# Patient Record
Sex: Female | Born: 1935 | Race: White | Hispanic: No | Marital: Married | Smoking: Former smoker
Health system: Southern US, Community
[De-identification: ages and names within clinical notes are randomized; demographics above are authoritative.]

## PROBLEM LIST (undated history)

## (undated) DIAGNOSIS — N189 Chronic kidney disease, unspecified: Secondary | ICD-10-CM

## (undated) DIAGNOSIS — C801 Malignant (primary) neoplasm, unspecified: Secondary | ICD-10-CM

## (undated) DIAGNOSIS — I10 Essential (primary) hypertension: Secondary | ICD-10-CM

## (undated) DIAGNOSIS — K219 Gastro-esophageal reflux disease without esophagitis: Secondary | ICD-10-CM

## (undated) DIAGNOSIS — I4891 Unspecified atrial fibrillation: Secondary | ICD-10-CM

## (undated) DIAGNOSIS — G4730 Sleep apnea, unspecified: Secondary | ICD-10-CM

## (undated) DIAGNOSIS — M1990 Unspecified osteoarthritis, unspecified site: Secondary | ICD-10-CM

## (undated) HISTORY — PX: ROTATOR CUFF REPAIR: SHX139

---

## 1982-03-24 HISTORY — PX: ABDOMINAL HYSTERECTOMY: SHX81

## 1992-03-24 HISTORY — PX: CHOLECYSTECTOMY: SHX55

## 2003-03-25 HISTORY — PX: FRACTURE SURGERY: SHX138

## 2012-03-24 HISTORY — PX: BREAST SURGERY: SHX581

## 2015-03-25 DIAGNOSIS — I499 Cardiac arrhythmia, unspecified: Secondary | ICD-10-CM

## 2015-03-25 HISTORY — PX: NASAL SINUS SURGERY: SHX719

## 2015-03-25 HISTORY — DX: Cardiac arrhythmia, unspecified: I49.9

## 2019-01-03 ENCOUNTER — Other Ambulatory Visit: Payer: Self-pay | Admitting: Urology

## 2019-01-03 DIAGNOSIS — N2889 Other specified disorders of kidney and ureter: Secondary | ICD-10-CM

## 2019-01-12 ENCOUNTER — Ambulatory Visit
Admission: RE | Admit: 2019-01-12 | Discharge: 2019-01-12 | Disposition: A | Discharge status: Home / Self Care | Payer: Self-pay | Source: Ambulatory Visit | Attending: Urology | Admitting: Urology

## 2019-01-12 ENCOUNTER — Encounter: Payer: Self-pay | Admitting: *Deleted

## 2019-01-12 ENCOUNTER — Other Ambulatory Visit: Payer: Self-pay

## 2019-01-12 DIAGNOSIS — N2889 Other specified disorders of kidney and ureter: Secondary | ICD-10-CM

## 2019-01-12 HISTORY — PX: IR RADIOLOGIST EVAL & MGMT: IMG5224

## 2019-01-12 NOTE — Consult Note (Signed)
Chief Complaint: Patient was consulted remotely today (TeleHealth) for ablation of a left renal mass at the request of Elder Love.    Referring Physician(s): Elder Love  History of Present Illness: Alicia Cole is a 85 y.o. female with a history of chronic kidney disease, stage III, followed by Dr. Aundra Dubin.  A renal ultrasound was performed in June after some worsening of renal function was noted with creatinine of 1.55 in February.  The ultrasound revealed what appeared to be a solid left renal mass and multiple bilateral renal cysts.  This led to MRI of the abdomen on 10/12/2018 and referral to Dr. Mikki Santee of [facility].  The MRI revealed a solid and enhancing mid to lower exophytic mass of the left kidney measuring approximately 2.5 x 2.4 x 2.9 cm.  The patient was referred to Dr. Percell Belt with IR at [facility] and she underwent attempted ablation on 12/06/2018 at [facility].  The patient states that at that time the colon could not be adequately mobilized away from the renal mass from what sounds like attempted hydro-dissection and ablation was aborted.  A biopsy of the mass was not performed at that time.  After a discussion with Dr. Jonette Eva, who had also performed a nephrectomy on Mrs. Nesler daughter, the patient has been referred here for another opinion regarding ablation of the left renal mass.  Mrs. Keasling denies any urinary symptoms.  She has no abdominal or flank pain.  She does complain of chronic fatigue but is able to perform activities of daily living independently and does housework.  She lives in a home with her husband.  Past Medical History: Left breast carcinoma:  Status post surgery, radiation therapy and chemotherapy in 2014.  The patient is followed by Dr. Nelda Marseille from [facility]. Atrial fibrillation Hypertension Gastroesophageal reflux Hypercholesteremia Sleep apnea. Uses CPAP at night for the past 6 months.  Allergies: No  known drug allergies.  Medications: Eliquis 5 mg BID Coreg 25 mb BID Zyrtec 10 mg QD Lasix 40 mg QD Prilosec 20 mg QD Magnesium 250 mg QD      Family History: Colon cancer, colonic polyps.  Social History   Socioeconomic History  . Marital status: Not on file    Spouse name: Not on file  . Number of children: Not on file  . Years of education: Not on file  . Highest education level: Not on file  Occupational History  . Not on file  Social Needs  . Financial resource strain: Not on file  . Food insecurity    Worry: Not on file    Inability: Not on file  . Transportation needs    Medical: Not on file    Non-medical: Not on file  Tobacco Use  . Smoking status: Not on file  Substance and Sexual Activity  . Alcohol use: Not on file  . Drug use: Not on file  . Sexual activity: Not on file  Lifestyle  . Physical activity    Days per week: Not on file    Minutes per session: Not on file  . Stress: Not on file  Relationships  . Social Herbalist on phone: Not on file    Gets together: Not on file    Attends religious service: Not on file    Active member of club or organization: Not on file    Attends meetings of clubs or organizations: Not on file    Relationship status: Not  on file  Other Topics Concern  . Not on file  Social History Narrative  . Not on file    ECOG Status: 0 - Asymptomatic  Review of Systems  Constitutional: Positive for fatigue. Negative for appetite change, chills and fever.  HENT: Negative.   Respiratory: Negative.   Cardiovascular: Negative.   Gastrointestinal: Negative.   Genitourinary: Negative.   Musculoskeletal: Negative.   Skin: Negative.   Neurological: Negative.     Review of Systems: A 12 point ROS discussed and pertinent positives are indicated in the HPI above.  All other systems are negative.  Physical Exam No direct physical exam was performed (except for noted visual exam findings with Video Visits).    Vital Signs: There were no vitals taken for this visit.  Imaging: No results found.  Labs:  CBC: No results for input(s): WBC, HGB, HCT, PLT in the last 8760 hours.  COAGS: No results for input(s): INR, APTT in the last 8760 hours.  BMP: Labs from 10/12/18 at [facility]: BUN 23, Cr 1.22, Na139, K 4.2, eGFR 45 mL/min.  Assessment and Plan:  I spoke with Mrs. Mancino over the phone.  I reviewed her MRI study from [facility] in July.  By my measurements, the left renal mass measures approximately 2.8 x 2.5 x 2.7 cm.  This mass is roughly 2/3 exophytic with one third endophytic component mostly within the lateral cortex of the interpolar left kidney.  The lesion appears solid and fairly uniformly enhances and is likely consistent with a renal carcinoma.  There is no evidence of renal vein tumor or metastatic disease in the abdomen by MRI.  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  Multiple bilateral renal cysts are present with the majority having the appearance of simple cysts.  A cluster of posterior lower pole cyst is mildly complex in appearance without definite enhancement.  I discussed the possibility of reattempting percutaneous cryoablation and also biopsy at the time of ablation with Mrs. Cragin.  In the setting of not having had prior colonic surgery or radiation to the abdomen, I am not sure exactly why the colon was unable to be adequately moved away from the renal mass utilizing a hydro-dissection maneuver during the September procedure.  It is possible that she may have had a large amount of fecal material in the colon at that time.  We can try a gentle bowel prep prior to a reattempted procedure.  A percutaneous ablation procedure would still be the optimal procedure for Mrs. Grace given her age and chronic kidney disease.  This would optimize remaining renal function and provide maximal  nephron sparing.  I did tell Mrs. Lacko that it is possible that a mass of the size may not give her any symptoms or metastatic disease for her remaining lifetime.  However, given its presence and the initial attempted procedure, Mrs. Szuch does not feel comfortable with leaving the lesion alone given that it is highly likely to be malignant based on imaging characteristics.  I believe that the mass should be amenable to percutaneous cryoablation under general anesthesia.  Biopsy will be performed at the same time to establish a tissue diagnosis.  Mrs. Butson would like to undergo percutaneous ablation.  The procedure will be scheduled at [facility] in [place].  We will begin the scheduling and authorization process.  Thank you for this interesting consult.  I greatly enjoyed meeting Kavin Leech  and look forward to participating in their care.  A copy of this report was sent to the requesting provider on this date.  Electronically Signed: Azzie Roup 01/12/2019, 8:50 AM     I spent a total of 40 Minutes in remote  clinical consultation, greater than 50% of which was counseling/coordinating care for treatment of a left renal mass.    Visit type: Audio only (telephone). Audio (no video) only due to patient's lack of internet/smartphone capability. Alternative for in-person consultation at [facility], [address] [place], [place], [place]. This visit type was conducted due to national recommendations for restrictions regarding the COVID-19 Pandemic (e.g. social distancing).  This format is felt to be most appropriate for this patient at this time.  All issues noted in this document were discussed and addressed.

## 2019-01-17 ENCOUNTER — Other Ambulatory Visit (HOSPITAL_COMMUNITY): Payer: Self-pay | Admitting: Interventional Radiology

## 2019-01-17 DIAGNOSIS — N2889 Other specified disorders of kidney and ureter: Secondary | ICD-10-CM

## 2019-01-27 ENCOUNTER — Other Ambulatory Visit: Payer: Self-pay | Admitting: Student

## 2019-01-27 NOTE — Patient Instructions (Addendum)
DUE TO COVID-19 ONLY ONE VISITOR IS ALLOWED TO COME WITH YOU AND STAY IN THE WAITING ROOM ONLY DURING PRE OP AND PROCEDURE DAY OF SURGERY. THE 1 VISITOR MAY VISIT WITH YOU AFTER SURGERY IN YOUR PRIVATE ROOM DURING VISITING HOURS ONLY!  YOU NEED TO HAVE A COVID 19 TEST ON_11/7/20______ @_12 :35 pm______, THIS TEST MUST BE DONE BEFORE SURGERY, COME  [address], [place] , [address].  ([place]) ONCE YOUR COVID TEST IS COMPLETED, PLEASE BEGIN THE QUARANTINE INSTRUCTIONS AS OUTLINED IN YOUR HANDOUT.                Alicia Cole    Your procedure is scheduled on: 02/02/19   Report to [facility] Main  Entrance   Report to admitting and then Radiology at 8:10 AM     Call this number if you have problems the morning of surgery [phone]    Remember: Do not eat food or drink liquids :After Midnight.   BRUSH YOUR TEETH MORNING OF SURGERY AND RINSE YOUR MOUTH OUT, NO CHEWING GUM CANDY OR MINTS.     Take these medicines the morning of surgery with A SIP OF WATER: Coreg, Claritin, Prilosec  Bring mask and tubing to the hosital                                You may not have any metal on your body including hair pins and              piercings              Do not wear jewelry, make-up, lotions, powders or perfumes, deodorant             Do not wear nail polish on your fingernails.  Do not shave  48 hours prior to surgery.           Do not bring valuables to the hospital. [place].  Contacts, dentures or bridgework may not be worn into surgery.       Patients discharged the day of surgery will not be allowed to drive home.     IF YOU ARE HAVING SURGERY AND GOING HOME THE SAME DAY, YOU MUST HAVE AN ADULT TO DRIVE YOU HOME AND BE WITH YOU FOR 24 HOURS  . YOU MAY GO HOME BY TAXI OR UBER OR ORTHERWISE, BUT AN ADULT MUST ACCOMPANY YOU HOME AND STAY WITH YOU FOR 24 HOURS.  Name and phone number of your  driver:  Special Instructions: N/A              Please read over the following fact sheets you were given: _____________________________________________________________________             [facility] - Preparing for Surgery  Before surgery, you can play an important role.   Because skin is not sterile, your skin needs to be as free of germs as possible.   You can reduce the number of germs on your skin by washing with CHG (chlorahexidine gluconate) soap before surgery.   CHG is an antiseptic cleaner which kills germs and bonds with the skin to continue killing germs even after washing. Please DO NOT use if you have an allergy to CHG or antibacterial soaps.   If your skin becomes reddened/irritated stop using the  CHG and inform your nurse when you arrive at Short Stay. Do not shave (including legs and underarms) for at least 48 hours prior to the first CHG shower.    Please follow these instructions carefully:  1.  Shower with CHG Soap the night before surgery and the  morning of Surgery.  2.  If you choose to wash your hair, wash your hair first as usual with your  normal  shampoo.  3.  After you shampoo, rinse your hair and body thoroughly to remove the  shampoo.                                        4.  Use CHG as you would any other liquid soap.  You can apply chg directly  to the skin and wash                       Gently with a scrungie or clean washcloth.  5.  Apply the CHG Soap to your body ONLY FROM THE NECK DOWN.   Do not use on face/ open                           Wound or open sores. Avoid contact with eyes, ears mouth and genitals (private parts).                       Wash face,  Genitals (private parts) with your normal soap.             6.  Wash thoroughly, paying special attention to the area where your surgery  will be performed.  7.  Thoroughly rinse your body with warm water from the neck down.  8.  DO NOT shower/wash with your normal soap after using and rinsing  off  the CHG Soap.             9.  Pat yourself dry with a clean towel.            10.  Wear clean pajamas.            11.  Place clean sheets on your bed the night of your first shower and do not  sleep with pets. Day of Surgery : Do not apply any lotions/deodorants the morning of surgery.  Please wear clean clothes to the hospital/surgery center.  FAILURE TO FOLLOW THESE INSTRUCTIONS MAY RESULT IN THE CANCELLATION OF YOUR SURGERY PATIENT SIGNATURE_________________________________  NURSE SIGNATURE__________________________________  ________________________________________________________________________

## 2019-01-28 ENCOUNTER — Other Ambulatory Visit: Payer: Self-pay

## 2019-01-28 ENCOUNTER — Encounter (HOSPITAL_COMMUNITY)
Admission: RE | Admit: 2019-01-28 | Discharge: 2019-01-28 | Disposition: A | Discharge status: Home / Self Care | Payer: Medicare Other | Source: Ambulatory Visit | Attending: Interventional Radiology | Admitting: Interventional Radiology

## 2019-01-28 ENCOUNTER — Encounter (HOSPITAL_COMMUNITY): Payer: Self-pay

## 2019-01-28 DIAGNOSIS — Z01812 Encounter for preprocedural laboratory examination: Secondary | ICD-10-CM | POA: Insufficient documentation

## 2019-01-28 HISTORY — DX: Essential (primary) hypertension: I10

## 2019-01-28 HISTORY — DX: Malignant (primary) neoplasm, unspecified: C80.1

## 2019-01-28 HISTORY — DX: Unspecified osteoarthritis, unspecified site: M19.90

## 2019-01-28 HISTORY — DX: Sleep apnea, unspecified: G47.30

## 2019-01-28 HISTORY — DX: Gastro-esophageal reflux disease without esophagitis: K21.9

## 2019-01-28 HISTORY — DX: Chronic kidney disease, unspecified: N18.9

## 2019-01-28 LAB — CBC WITH DIFFERENTIAL/PLATELET
Abs Immature Granulocytes: 0.09 K/uL — ABNORMAL HIGH (ref 0.00–0.07)
Basophils Absolute: 0 K/uL (ref 0.0–0.1)
Basophils Relative: 0 %
Eosinophils Absolute: 0.2 K/uL (ref 0.0–0.5)
Eosinophils Relative: 2 %
HCT: 39.7 % (ref 36.0–46.0)
Hemoglobin: 12.7 g/dL (ref 12.0–15.0)
Immature Granulocytes: 1 %
Lymphocytes Relative: 31 %
Lymphs Abs: 2.8 K/uL (ref 0.7–4.0)
MCH: 31 pg (ref 26.0–34.0)
MCHC: 32 g/dL (ref 30.0–36.0)
MCV: 96.8 fL (ref 80.0–100.0)
Monocytes Absolute: 0.7 K/uL (ref 0.1–1.0)
Monocytes Relative: 7 %
Neutro Abs: 5.2 K/uL (ref 1.7–7.7)
Neutrophils Relative %: 59 %
Platelets: 266 K/uL (ref 150–400)
RBC: 4.1 MIL/uL (ref 3.87–5.11)
RDW: 13.8 % (ref 11.5–15.5)
WBC: 8.9 K/uL (ref 4.0–10.5)
nRBC: 0 % (ref 0.0–0.2)

## 2019-01-28 LAB — BASIC METABOLIC PANEL
Anion gap: 7 (ref 5–15)
BUN: 21 mg/dL (ref 8–23)
CO2: 30 mmol/L (ref 22–32)
Calcium: 9.8 mg/dL (ref 8.9–10.3)
Chloride: 104 mmol/L (ref 98–111)
Creatinine, Ser: 1.24 mg/dL — ABNORMAL HIGH (ref 0.44–1.00)
GFR calc Af Amer: 46 mL/min — ABNORMAL LOW (ref >60)
GFR calc non Af Amer: 40 mL/min — ABNORMAL LOW (ref >60)
Glucose, Bld: 114 mg/dL — ABNORMAL HIGH (ref 70–99)
Potassium: 4.3 mmol/L (ref 3.5–5.1)
Sodium: 141 mmol/L (ref 135–145)

## 2019-01-28 LAB — PROTIME-INR
INR: 1.2 (ref 0.8–1.2)
Prothrombin Time: 14.7 s (ref 11.4–15.2)

## 2019-01-28 NOTE — Progress Notes (Signed)
PCP - Everardo All PA Cardiologist - Dr. Shirlee More  Chest x-ray -  EKG -  Stress Test -  ECHO -  Cardiac Cath -   Sleep Study - yes CPAP - uses C-Pap  Fasting Blood Sugar - NA Checks Blood Sugar _____ times a day  Blood Thinner Instructions:Eliquis Aspirin Instructions:stop 3 days prior to surgery. Last Dose:01/30/19  Anesthesia review:   Patient denies shortness of breath, fever, cough and chest pain at PAT appointment yes  Patient verbalized understanding of instructions that were given to them at the PAT appointment. Patient was also instructed that they will need to review over the PAT instructions again at home before surgery. Yes Called IR PA line to request cardiac clearance.01/28/19. left message

## 2019-01-29 ENCOUNTER — Other Ambulatory Visit (HOSPITAL_COMMUNITY)
Admission: RE | Admit: 2019-01-29 | Discharge: 2019-01-29 | Disposition: A | Discharge status: Home / Self Care | Payer: Medicare Other | Source: Ambulatory Visit | Attending: Interventional Radiology | Admitting: Interventional Radiology

## 2019-01-29 DIAGNOSIS — Z01812 Encounter for preprocedural laboratory examination: Secondary | ICD-10-CM | POA: Insufficient documentation

## 2019-01-29 DIAGNOSIS — Z20828 Contact with and (suspected) exposure to other viral communicable diseases: Secondary | ICD-10-CM | POA: Insufficient documentation

## 2019-01-29 LAB — ABO/RH: ABO/RH(D): A POS

## 2019-01-30 LAB — NOVEL CORONAVIRUS, NAA (HOSP ORDER, SEND-OUT TO REF LAB; TAT 18-24 HRS): SARS-CoV-2, NAA: NOT DETECTED

## 2019-02-01 ENCOUNTER — Other Ambulatory Visit: Payer: Self-pay | Admitting: Radiology

## 2019-02-02 ENCOUNTER — Observation Stay (HOSPITAL_COMMUNITY)
Admission: RE | Admit: 2019-02-02 | Discharge: 2019-02-03 | Disposition: A | Discharge status: Home / Self Care | Payer: Medicare Other | Attending: Interventional Radiology | Admitting: Interventional Radiology

## 2019-02-02 ENCOUNTER — Encounter (HOSPITAL_COMMUNITY): Payer: Self-pay | Admitting: Anesthesiology

## 2019-02-02 ENCOUNTER — Encounter (HOSPITAL_COMMUNITY)
Admission: RE | Disposition: A | Discharge status: Home / Self Care | Payer: Self-pay | Source: Home / Self Care | Attending: Interventional Radiology

## 2019-02-02 ENCOUNTER — Ambulatory Visit (HOSPITAL_COMMUNITY)
Admission: RE | Admit: 2019-02-02 | Discharge: 2019-02-02 | Disposition: A | Discharge status: Home / Self Care | Payer: Medicare Other | Source: Ambulatory Visit | Attending: Interventional Radiology | Admitting: Interventional Radiology

## 2019-02-02 ENCOUNTER — Ambulatory Visit (HOSPITAL_COMMUNITY): Payer: Medicare Other | Admitting: Physician Assistant

## 2019-02-02 ENCOUNTER — Other Ambulatory Visit: Payer: Self-pay

## 2019-02-02 ENCOUNTER — Ambulatory Visit (HOSPITAL_COMMUNITY): Payer: Medicare Other | Admitting: Anesthesiology

## 2019-02-02 ENCOUNTER — Ambulatory Visit (HOSPITAL_COMMUNITY): Payer: Medicare Other

## 2019-02-02 DIAGNOSIS — N183 Chronic kidney disease, stage 3 unspecified: Secondary | ICD-10-CM | POA: Insufficient documentation

## 2019-02-02 DIAGNOSIS — E785 Hyperlipidemia, unspecified: Secondary | ICD-10-CM | POA: Diagnosis not present

## 2019-02-02 DIAGNOSIS — K219 Gastro-esophageal reflux disease without esophagitis: Secondary | ICD-10-CM | POA: Diagnosis not present

## 2019-02-02 DIAGNOSIS — Z853 Personal history of malignant neoplasm of breast: Secondary | ICD-10-CM | POA: Insufficient documentation

## 2019-02-02 DIAGNOSIS — Z7901 Long term (current) use of anticoagulants: Secondary | ICD-10-CM | POA: Diagnosis not present

## 2019-02-02 DIAGNOSIS — M1990 Unspecified osteoarthritis, unspecified site: Secondary | ICD-10-CM | POA: Diagnosis not present

## 2019-02-02 DIAGNOSIS — Z87891 Personal history of nicotine dependence: Secondary | ICD-10-CM | POA: Insufficient documentation

## 2019-02-02 DIAGNOSIS — N2889 Other specified disorders of kidney and ureter: Secondary | ICD-10-CM

## 2019-02-02 DIAGNOSIS — I129 Hypertensive chronic kidney disease with stage 1 through stage 4 chronic kidney disease, or unspecified chronic kidney disease: Secondary | ICD-10-CM | POA: Diagnosis not present

## 2019-02-02 DIAGNOSIS — G4730 Sleep apnea, unspecified: Secondary | ICD-10-CM | POA: Insufficient documentation

## 2019-02-02 DIAGNOSIS — C649 Malignant neoplasm of unspecified kidney, except renal pelvis: Principal | ICD-10-CM | POA: Insufficient documentation

## 2019-02-02 DIAGNOSIS — Z01818 Encounter for other preprocedural examination: Secondary | ICD-10-CM

## 2019-02-02 DIAGNOSIS — Z79899 Other long term (current) drug therapy: Secondary | ICD-10-CM | POA: Insufficient documentation

## 2019-02-02 DIAGNOSIS — I4891 Unspecified atrial fibrillation: Secondary | ICD-10-CM | POA: Diagnosis not present

## 2019-02-02 HISTORY — PX: RADIOLOGY WITH ANESTHESIA: SHX6223

## 2019-02-02 HISTORY — DX: Unspecified atrial fibrillation: I48.91

## 2019-02-02 LAB — BASIC METABOLIC PANEL
Anion gap: 8 (ref 5–15)
BUN: 21 mg/dL (ref 8–23)
CO2: 27 mmol/L (ref 22–32)
Calcium: 9.8 mg/dL (ref 8.9–10.3)
Chloride: 103 mmol/L (ref 98–111)
Creatinine, Ser: 1.3 mg/dL — ABNORMAL HIGH (ref 0.44–1.00)
GFR calc Af Amer: 43 mL/min — ABNORMAL LOW (ref >60)
GFR calc non Af Amer: 37 mL/min — ABNORMAL LOW (ref >60)
Glucose, Bld: 112 mg/dL — ABNORMAL HIGH (ref 70–99)
Potassium: 4 mmol/L (ref 3.5–5.1)
Sodium: 138 mmol/L (ref 135–145)

## 2019-02-02 LAB — TYPE AND SCREEN
ABO/RH(D): A POS
Antibody Screen: NEGATIVE

## 2019-02-02 SURGERY — CT WITH ANESTHESIA
Anesthesia: General

## 2019-02-02 MED ORDER — ONDANSETRON HCL 4 MG/2ML IJ SOLN: 4.0000 mg | Freq: Four times a day (QID) | INTRAMUSCULAR | Status: DC | PRN

## 2019-02-02 MED ORDER — LORATADINE 10 MG PO TABS
10.0000 mg | ORAL_TABLET | Freq: Every day | ORAL | Status: DC
  Administered 2019-02-03: 10 mg via ORAL
  Filled 2019-02-02: qty 1

## 2019-02-02 MED ORDER — SENNOSIDES-DOCUSATE SODIUM 8.6-50 MG PO TABS
1.0000 | ORAL_TABLET | Freq: Every day | ORAL | Status: DC | PRN
  Filled 2019-02-02: qty 1

## 2019-02-02 MED ORDER — POLYVINYL ALCOHOL 1.4 % OP SOLN: 1.0000 [drp] | OPHTHALMIC | Status: DC | PRN

## 2019-02-02 MED ORDER — OXYCODONE HCL 5 MG/5ML PO SOLN: 5.0000 mg | Freq: Once | ORAL | Status: DC | PRN

## 2019-02-02 MED ORDER — PHENYLEPHRINE HCL (PRESSORS) 10 MG/ML IV SOLN
INTRAVENOUS | Status: DC | PRN
  Administered 2019-02-02 (×4): 80 ug via INTRAVENOUS

## 2019-02-02 MED ORDER — PROPOFOL 10 MG/ML IV BOLUS
INTRAVENOUS | Status: DC | PRN
  Administered 2019-02-02: 100 mg via INTRAVENOUS

## 2019-02-02 MED ORDER — FENTANYL CITRATE (PF) 100 MCG/2ML IJ SOLN: 25.0000 ug | INTRAMUSCULAR | Status: DC | PRN

## 2019-02-02 MED ORDER — ONDANSETRON HCL 4 MG/2ML IJ SOLN
INTRAMUSCULAR | Status: DC | PRN
  Administered 2019-02-02: 4 mg via INTRAVENOUS

## 2019-02-02 MED ORDER — FUROSEMIDE 40 MG PO TABS
40.0000 mg | ORAL_TABLET | ORAL | Status: DC
  Administered 2019-02-03: 40 mg via ORAL
  Filled 2019-02-02: qty 1

## 2019-02-02 MED ORDER — HYDROCODONE-ACETAMINOPHEN 5-325 MG PO TABS: 1.0000 | ORAL_TABLET | ORAL | Status: DC | PRN

## 2019-02-02 MED ORDER — SUGAMMADEX SODIUM 200 MG/2ML IV SOLN
INTRAVENOUS | Status: DC | PRN
  Administered 2019-02-02: 200 mg via INTRAVENOUS

## 2019-02-02 MED ORDER — DOCUSATE SODIUM 100 MG PO CAPS
100.0000 mg | ORAL_CAPSULE | Freq: Two times a day (BID) | ORAL | Status: DC
  Administered 2019-02-02 – 2019-02-03 (×2): 100 mg via ORAL
  Filled 2019-02-02 (×2): qty 1

## 2019-02-02 MED ORDER — SENNOSIDES-DOCUSATE SODIUM 8.6-50 MG PO TABS: 1.0000 | ORAL_TABLET | Freq: Every day | ORAL | Status: DC | PRN

## 2019-02-02 MED ORDER — ROCURONIUM BROMIDE 100 MG/10ML IV SOLN
INTRAVENOUS | Status: DC | PRN
  Administered 2019-02-02: 50 mg via INTRAVENOUS
  Administered 2019-02-02: 10 mg via INTRAVENOUS

## 2019-02-02 MED ORDER — CEFAZOLIN SODIUM-DEXTROSE 2-4 GM/100ML-% IV SOLN
2.0000 g | Freq: Once | INTRAVENOUS | Status: AC
  Administered 2019-02-02: 2 g via INTRAVENOUS
  Filled 2019-02-02: qty 100

## 2019-02-02 MED ORDER — LIDOCAINE HCL (CARDIAC) PF 100 MG/5ML IV SOSY
PREFILLED_SYRINGE | INTRAVENOUS | Status: DC | PRN
  Administered 2019-02-02: 60 mg via INTRAVENOUS

## 2019-02-02 MED ORDER — DOCUSATE SODIUM 100 MG PO CAPS
100.0000 mg | ORAL_CAPSULE | Freq: Two times a day (BID) | ORAL | Status: DC
  Filled 2019-02-02: qty 1

## 2019-02-02 MED ORDER — LACTATED RINGERS IV SOLN
INTRAVENOUS | Status: DC
  Administered 2019-02-02 (×2): via INTRAVENOUS

## 2019-02-02 MED ORDER — OXYCODONE HCL 5 MG PO TABS: 5.0000 mg | ORAL_TABLET | Freq: Once | ORAL | Status: DC | PRN

## 2019-02-02 MED ORDER — FENTANYL CITRATE (PF) 100 MCG/2ML IJ SOLN
INTRAMUSCULAR | Status: DC | PRN
  Administered 2019-02-02: 25 ug via INTRAVENOUS
  Administered 2019-02-02: 100 ug via INTRAVENOUS

## 2019-02-02 MED ORDER — FENTANYL CITRATE (PF) 250 MCG/5ML IJ SOLN
INTRAMUSCULAR | Status: AC
  Filled 2019-02-02: qty 5

## 2019-02-02 MED ORDER — DEXAMETHASONE SODIUM PHOSPHATE 10 MG/ML IJ SOLN
INTRAMUSCULAR | Status: DC | PRN
  Administered 2019-02-02: 5 mg via INTRAVENOUS

## 2019-02-02 MED ORDER — CARBOXYMETHYLCELLULOSE SODIUM 0.25 % OP SOLN: 1.0000 [drp] | Freq: Three times a day (TID) | OPHTHALMIC | Status: DC | PRN

## 2019-02-02 MED ORDER — SODIUM CHLORIDE 0.9 % IV SOLN
INTRAVENOUS | Status: DC
  Administered 2019-02-02: 14:00:00 via INTRAVENOUS

## 2019-02-02 MED ORDER — PANTOPRAZOLE SODIUM 40 MG PO TBEC
40.0000 mg | DELAYED_RELEASE_TABLET | Freq: Every day | ORAL | Status: DC
  Administered 2019-02-02 – 2019-02-03 (×2): 40 mg via ORAL
  Filled 2019-02-02 (×2): qty 1

## 2019-02-02 NOTE — H&P (Signed)
Referring Physician(s): Werle,D  Supervising Physician: Aletta Edouard  Patient Status:  [facility] OP TBA  Chief Complaint:  Left renal mass  Subjective: Patient familiar to IR service from tele consultation with Dr. Kathlene Cote on 01/12/2019 to discuss treatment options for solid and enhancing mid to lower exophytic mass of the left kidney concerning for renal cell carcinoma.  She has a history of atrial fibrillation on Eliquis, prior left breast cancer in 2014, status post surgery and chemoradiation, gastroesophageal reflux disease, hyperlipidemia, sleep apnea, hypertension, stage III chronic kidney disease along with the above-noted left renal mass measuring up to 2.9 cm in addition to bilateral renal cysts.  She underwent attempted ablation of the left renal mass on 12/06/2018 at [facility] but the colon could not be adequately mobilized away from the mass during attempted hydrodissection and the ablation was aborted.  A biopsy of the mass was not performed at that time.  Following discussions with Dr. Kathlene Cote she was deemed an appropriate candidate for CT-guided cryoablation and biopsy of the left renal mass and presents today for the procedure.  She currently denies fever, chest pain, dyspnea, abdominal/back pain, nausea, vomiting or bleeding.  She does have some chronic fatigue, occasional headaches as well as chronic cough.  Is an ex-smoker.  Past Medical History:  Diagnosis Date  . Arthritis    hands,knee,lower back  . Atrial fibrillation ([place])   . 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 Chronic kidney disease    stage 3  . Dysrhythmia 2017   A Fib  . GERD (gastroesophageal reflux disease)   . Hypertension   . Sleep apnea    uses C-Pap   Past Surgical History:  Procedure Laterality Date  . ABDOMINAL HYSTERECTOMY  1984  . BREAST SURGERY Left 2014    Lumpectomy. restrictions on lt arm  . CHOLECYSTECTOMY  1994  . FRACTURE SURGERY Right 2005   leg.  with plate  . IR RADIOLOGIST EVAL & MGMT  01/12/2019  . NASAL SINUS SURGERY Bilateral 2017  . ROTATOR CUFF REPAIR Bilateral [id number]       Allergies: Patient has no known allergies.  Medications: Prior to Admission medications   Medication Sig Start Date End Date Taking? Authorizing Provider  acetaminophen (TYLENOL) 500 MG tablet Take 500 mg by mouth daily after breakfast.   Yes [provider]  Biotin 5000 MCG TABS Take 5,000 mcg by mouth every evening.   Yes [provider]  Carboxymethylcellulose Sodium (THERATEARS) 0.25 % SOLN Place 1 drop into both eyes 3 (three) times daily as needed (runny eyes.).   Yes [provider]  carvedilol (COREG) 25 MG tablet Take 25 mg by mouth 2 (two) times daily. 11/19/18  Yes [provider]  Coenzyme Q10 (COQ-10) 100 MG CAPS Take 100 mg by mouth every evening.   Yes [provider]  ELIQUIS 5 MG TABS tablet Take 5 mg by mouth 2 (two) times daily. 11/19/18  Yes [provider]  furosemide (LASIX) 40 MG tablet Take 40 mg by mouth every other day. In the morning. 11/19/18  Yes [provider]  loratadine (CLARITIN) 10 MG tablet Take 10 mg by mouth daily.   Yes [provider]  Magnesium 250 MG TABS Take 250 mg by mouth daily.   Yes [provider]  Multiple Vitamin (MULTIVITAMIN WITH MINERALS) TABS tablet Take 1 tablet by mouth every evening. Centrum   Yes [provider]  omeprazole (  PRILOSEC) 20 MG capsule Take 20 mg by mouth every evening. 11/19/18  Yes [provider]  Red Yeast Rice 600 MG TABS Take 600 mg by mouth every evening.   Yes [provider]  Turmeric 500 MG TABS Take 500 mg by mouth every evening.   Yes [provider]     Vital Signs: Blood pressure 178/89, heart rate 109, temp 98.3, respirations 16, O2 sat 99% room air Ht 5\' 4"  (1.626 m)   Wt 161 lb 4 oz (73.1 kg)   BMI 27.68 kg/m   Physical Exam awake, alert.   Chest clear to auscultation bilaterally.  Heart with irregularly irregular rhythm.  Abdomen soft, positive bowel sounds, nontender.  Extremities with full range of motion.  Imaging: No results found.  Labs:  CBC: Recent Labs    01/28/19 1459  WBC 8.9  HGB 12.7  HCT 39.7  PLT 266    COAGS: Recent Labs    01/28/19 1459  INR 1.2    BMP: Recent Labs    01/28/19 1459 02/02/19 0735  NA 141 138  K 4.3 4.0  CL 104 103  CO2 30 27  GLUCOSE 114* 112*  BUN 21 21  CALCIUM 9.8 9.8  CREATININE 1.24* 1.30*  GFRNONAA 40* 37*  GFRAA 46* 43*    LIVER FUNCTION TESTS: No results for input(s): BILITOT, AST, ALT, ALKPHOS, PROT, ALBUMIN in the last 8760 hours.  Assessment and Plan: Pt with history of atrial fibrillation on Eliquis, prior left breast cancer in 2014, status post surgery and chemoradiation, gastroesophageal reflux disease, hyperlipidemia, sleep apnea, hypertension, stage III chronic kidney disease along with left renal mass measuring up to 2.9 cm concerning for renal cell carcinoma in addition to bilateral renal cysts.  She underwent attempted ablation of the left renal mass on 12/06/2018 at [facility] but the colon could not be adequately mobilized away from the mass during attempted hydrodissection and the ablation was aborted.  A biopsy of the mass was not performed at that time.  Following discussions with Dr. Kathlene Cote she was deemed an appropriate candidate for CT-guided cryoablation and biopsy of the left renal mass and presents today for the procedure.  Details/risks of procedure, including not limited to, internal bleeding, infection, injury to adjacent structures, anesthesia related complications discussed with patient with her understanding and consent.  Post procedure she will be admitted for overnight observation. Creat today 1.3.  Electronically Signed: D. Rowe Robert, PA-C 02/02/2019, 8:07 AM   I spent a total of 30 minutes at the the patient's bedside  AND on the patient's hospital floor or unit, greater than 50% of which was counseling/coordinating care for CT-guided cryoablation and possible biopsy of left renal mass

## 2019-02-02 NOTE — Progress Notes (Signed)
Patient ID: CONNI BROUILLARD, female   DOB: 1931/10/23, 85 y.o.   MRN: [id number] Patient currently denies fever, headache, chest pain, dyspnea, cough, significant abdominal or left flank pain, nausea, vomiting.  Does have some mild positional mid back pain. BP 143/79, heart rate 95, temp 98.2, O2 sats 96% room air Puncture site left flank clean, dry, not significantly tender, no active bleeding externally; blood-tinged urine in Foley bag with a small amount of clot in tubing   A/P: Status post CT and ultrasound-guided left renal mass biopsy and cryoablation earlier today.  For overnight obs; check a.m. labs; continue Foley for now due to hematuria; follow-up phone call or office visit with IR in 3 to 4 weeks and imaging in 3 months  Rowe Robert, [facility]

## 2019-02-02 NOTE — Transfer of Care (Signed)
Immediate Anesthesia Transfer of Care Note  Patient: Alicia Cole  Procedure(s) Performed: Procedure(s): CT RENAL CRYOABLATION AND BIOPSY (N/A)  Patient Location: PACU  Anesthesia Type:General  Level of Consciousness: Alert, Awake, Oriented  Airway & Oxygen Therapy: Patient Spontanous Breathing  Post-op Assessment: Report given to RN  Post vital signs: Reviewed and stable  Last Vitals:  Vitals:   02/02/19 1215  BP: 125/65  Pulse: 62  Resp: (!) 23  Temp: (!) 36.4 C  SpO2: [id number]    Complications: No apparent anesthesia complications     Complications: No apparent anesthesia complications

## 2019-02-02 NOTE — Anesthesia Preprocedure Evaluation (Signed)
Anesthesia Evaluation  Patient identified by MRN, date of birth, ID band Patient awake    Reviewed: Allergy & Precautions, H&P , NPO status , Patient's Chart, lab work & pertinent test results  Airway Mallampati: II   Neck ROM: full    Dental   Pulmonary sleep apnea , former smoker,    breath sounds clear to auscultation       Cardiovascular hypertension, + dysrhythmias Atrial Fibrillation  Rhythm:regular Rate:Normal     Neuro/Psych    GI/Hepatic GERD  ,  Endo/Other    Renal/GU Renal InsufficiencyRenal disease     Musculoskeletal  (+) Arthritis ,   Abdominal   Peds  Hematology   Anesthesia Other Findings   Reproductive/Obstetrics                             Anesthesia Physical Anesthesia Plan  ASA: III  Anesthesia Plan: General   Post-op Pain Management:    Induction: Intravenous  PONV Risk Score and Plan: 3 and Ondansetron, Dexamethasone and Treatment may vary due to age or medical condition  Airway Management Planned: Oral ETT  Additional Equipment:   Intra-op Plan:   Post-operative Plan: Extubation in OR  Informed Consent: I have reviewed the patients History and Physical, chart, labs and discussed the procedure including the risks, benefits and alternatives for the proposed anesthesia with the patient or authorized representative who has indicated his/her understanding and acceptance.       Plan Discussed with: CRNA, Anesthesiologist and Surgeon  Anesthesia Plan Comments:         Anesthesia Quick Evaluation

## 2019-02-02 NOTE — Anesthesia Procedure Notes (Signed)
Procedure Name: Intubation Date/Time: 02/02/2019 8:43 AM Performed by: Gerald Leitz, CRNA Pre-anesthesia Checklist: Patient identified, Patient being monitored, Timeout performed, Emergency Drugs available and Suction available Patient Re-evaluated:Patient Re-evaluated prior to induction Oxygen Delivery Method: Circle system utilized Preoxygenation: Pre-oxygenation with 100% oxygen Induction Type: IV induction Ventilation: Mask ventilation without difficulty Laryngoscope Size: Mac and 3 Grade View: Grade I Tube type: Oral Tube size: 7.0 mm Number of attempts: 1 Airway Equipment and Method: Stylet Placement Confirmation: ETT inserted through vocal cords under direct vision,  positive ETCO2 and breath sounds checked- equal and bilateral Secured at: 22 cm Tube secured with: Tape Dental Injury: Teeth and Oropharynx as per pre-operative assessment

## 2019-02-02 NOTE — Procedures (Signed)
Interventional Radiology Procedure Note  Procedure: CT and US guided left renal mass biopsy and cryoablation  Anesthesia: General  Complications: None  Estimated Blood Loss: < 10 mL  Findings: Biopsy via 17 G needle; 18 G core biopsy x 1. Hydrodissection performed with 200 mL saline/contrast mixture. Cryoablation via 2 Ice Force CX probes.   Plan: PACU recovery followed by overnight observation.  Alicia Cole. Kathlene Cote, M.D Pager:  [phone]

## 2019-02-03 ENCOUNTER — Encounter (HOSPITAL_COMMUNITY): Payer: Self-pay | Admitting: Interventional Radiology

## 2019-02-03 DIAGNOSIS — C649 Malignant neoplasm of unspecified kidney, except renal pelvis: Secondary | ICD-10-CM | POA: Diagnosis not present

## 2019-02-03 LAB — CBC
HCT: 32.8 % — ABNORMAL LOW (ref 36.0–46.0)
Hemoglobin: 10.6 g/dL — ABNORMAL LOW (ref 12.0–15.0)
MCH: 30.8 pg (ref 26.0–34.0)
MCHC: 32.3 g/dL (ref 30.0–36.0)
MCV: 95.3 fL (ref 80.0–100.0)
Platelets: 190 10*3/uL (ref 150–400)
RBC: 3.44 MIL/uL — ABNORMAL LOW (ref 3.87–5.11)
RDW: 13.7 % (ref 11.5–15.5)
WBC: 13.5 10*3/uL — ABNORMAL HIGH (ref 4.0–10.5)
nRBC: 0 % (ref 0.0–0.2)

## 2019-02-03 LAB — BASIC METABOLIC PANEL
Anion gap: 9 (ref 5–15)
BUN: 27 mg/dL — ABNORMAL HIGH (ref 8–23)
CO2: 23 mmol/L (ref 22–32)
Calcium: 8.4 mg/dL — ABNORMAL LOW (ref 8.9–10.3)
Chloride: 100 mmol/L (ref 98–111)
Creatinine, Ser: 1.42 mg/dL — ABNORMAL HIGH (ref 0.44–1.00)
GFR calc Af Amer: 39 mL/min — ABNORMAL LOW (ref >60)
GFR calc non Af Amer: 34 mL/min — ABNORMAL LOW (ref >60)
Glucose, Bld: 117 mg/dL — ABNORMAL HIGH (ref 70–99)
Potassium: 3.7 mmol/L (ref 3.5–5.1)
Sodium: 132 mmol/L — ABNORMAL LOW (ref 135–145)

## 2019-02-03 LAB — SURGICAL PATHOLOGY

## 2019-02-03 NOTE — Progress Notes (Signed)
Pt has continued with hematuria throughout the night, although this morning urine does appear more clear or less opaque. Small blood clots present in tubing. Good UOP throughout the shift, no complaints of pain. Continue to monitor. Hortencia Conradi RN

## 2019-02-03 NOTE — Anesthesia Postprocedure Evaluation (Signed)
Anesthesia Post Note  Patient: YARIS KIBE  Procedure(s) Performed: CT RENAL CRYOABLATION AND BIOPSY (N/A )     Patient location during evaluation: PACU Anesthesia Type: General Level of consciousness: awake and alert Pain management: pain level controlled Vital Signs Assessment: post-procedure vital signs reviewed and stable Respiratory status: spontaneous breathing, nonlabored ventilation, respiratory function stable and patient connected to nasal cannula oxygen Cardiovascular status: blood pressure returned to baseline and stable Postop Assessment: no apparent nausea or vomiting Anesthetic complications: no    Last Vitals:  Vitals:   02/02/19 2103 02/03/19 0617  BP: 116/74 121/76  Pulse: (!) 102 76  Resp: 19 16  Temp: 36.5 C (!) 36.4 C  SpO2: 94% 95%    Last Pain:  Vitals:   02/03/19 1016  TempSrc:   PainSc: 0-No pain                 Jeda Pardue S

## 2019-02-03 NOTE — Discharge Summary (Signed)
Patient ID: Alicia Cole MRN: [id number] DOB/AGE: 1931-04-07 85 y.o.  Admit date: 02/02/2019 Discharge date: 02/03/2019  Supervising Physician: Sandi Mariscal  Patient Status: [facility] - In-pt  Admission Diagnoses: Left renal mass  Discharge Diagnoses:  Active Problems:   Left renal mass   Discharged Condition: stable  Hospital Course:  Patient presented to [facility] 02/02/2019 for an image-guided core biopsy and cryoablation of left renal mass by Dr. Kathlene Cote. Procedure occurred without major complications and patient was transferred to floor in stable condition (VSS, left flank incisions stable) for overnight observation. Overnight, patient noted to have hematuria, which decreased in severity overnight.  Patient awake and alert sitting in bed with no complaints at this time. Still with mild hematuria (pink urine with a few small clots). Foley was discontinued and patient voided, still with mild hematuria as expected. Left flank incisions stable. Plan to discharge home today and follow-up with Dr. Kathlene Cote for tele-visit 3-4 weeks after discharge.  DO NOT RESTART ELIQUIS UNTIL SATURDAY 02/05/2019.   Consults: None  Significant Diagnostic Studies: Dg Chest 1 View  Result Date: 02/02/2019 CLINICAL DATA:  85 year old female with preop evaluation. EXAM: CHEST  1 VIEW COMPARISON:  Chest radiograph dated 10/18/2013 FINDINGS: There is no focal consolidation, pleural effusion, or pneumothorax. Probable small partially calcified granuloma at the right lung base. The cardiac silhouette is within normal limits. Atherosclerotic calcification of the aortic arch. Multiple surgical clips noted over the left chest. No acute osseous pathology. IMPRESSION: No acute cardiopulmonary process. Electronically Signed   By: Anner Crete M.D.   On: 02/02/2019 08:12   Ct Guide Tissue Ablation  Result Date: 02/02/2019 CLINICAL DATA:  2.9 cm left renal mass suspicious for renal carcinoma by imaging. The  patient presents for biopsy and percutaneous ablation the mass. EXAM: CT-GUIDED CORE BIOPSY OF LEFT RENAL MASS CT-GUIDED PERCUTANEOUS CRYOABLATION OF LEFT RENAL MASS ANESTHESIA/SEDATION: General MEDICATIONS: 2 g IV Ancef. The antibiotic was administered in an appropriate time interval prior to needle puncture of the skin. CONTRAST:  None PROCEDURE: The procedure, risks, benefits, and alternatives were explained to the patient. Questions regarding the procedure were encouraged and answered. The patient understands and consents to the procedure. A time-out was performed prior to initiating the procedure. The patient was placed under general anesthesia. Initial unenhanced CT was performed in a prone position to localize the left kidney. The left flank region was prepped with chlorhexidine in a sterile fashion, and a sterile drape was applied covering the operative field. A sterile gown and sterile gloves were used for the procedure. A 17 gauge trocar needle was advanced into the left renal mass. Core biopsy was performed with an 18 gauge automated core biopsy device. A core sample was submitted in formalin for pathologic analysis. Under CT guidance, 2 separate [employer] CX percutaneous cryoablation probes were advanced into the left renal mass. Probe positioning was confirmed by CT prior to cryoablation. Hydrodissection was then performed between the lateral aspect of the mass and adjacent descending colon via a 22 gauge spinal needle. A mixture of 250 mL of sterile saline with 5 mL of Omnipaque 300 contrast was utilized. Approximately 200 mL of hydrodissection fluid was injected during the procedure in order to displace the colon laterally and provide a buffer of space during cryoablation to protect the colon from injury. Cryoablation was performed through the 2 probes simultaneously. Initial 10 minute cycle of cryoablation was performed. This was followed by a 8 minute thaw cycle. A second 10 minute  cycle of  cryoablation was then performed. During ablation, periodic CT imaging was performed to monitor ice ball formation and morphology. After active thaw, a 30 second cautery cycle was performed. The cryoablation probes were then removed. COMPLICATIONS: None FINDINGS: The predominately exophytic lateral interpolar/lower pole mass of the left kidney was well visualized without contrast just superior to an exophytic cyst. This mass is partially calcified internally by CT and measures approximately 2.3 x 3.0 cm on axial images. Solid tissue was obtained with biopsy. Hydrodissection was successful in displacing the colon laterally and creating adequate space between the lateral margin of the mass and the colon for ablation. During cryoablation, CT demonstrates adequate ice ball formation encompassing the mass. IMPRESSION: CT guided core biopsy and cryoablation of left renal mass. The patient will be observed overnight. Electronically Signed   By: Aletta Edouard M.D.   On: 02/02/2019 15:30   Ct Biopsy  Result Date: 02/02/2019 CLINICAL DATA:  2.9 cm left renal mass suspicious for renal carcinoma by imaging. The patient presents for biopsy and percutaneous ablation the mass. EXAM: CT-GUIDED CORE BIOPSY OF LEFT RENAL MASS CT-GUIDED PERCUTANEOUS CRYOABLATION OF LEFT RENAL MASS ANESTHESIA/SEDATION: General MEDICATIONS: 2 g IV Ancef. The antibiotic was administered in an appropriate time interval prior to needle puncture of the skin. CONTRAST:  None PROCEDURE: The procedure, risks, benefits, and alternatives were explained to the patient. Questions regarding the procedure were encouraged and answered. The patient understands and consents to the procedure. A time-out was performed prior to initiating the procedure. The patient was placed under general anesthesia. Initial unenhanced CT was performed in a prone position to localize the left kidney. The left flank region was prepped with chlorhexidine in a sterile fashion, and a  sterile drape was applied covering the operative field. A sterile gown and sterile gloves were used for the procedure. A 17 gauge trocar needle was advanced into the left renal mass. Core biopsy was performed with an 18 gauge automated core biopsy device. A core sample was submitted in formalin for pathologic analysis. Under CT guidance, 2 separate [employer] CX percutaneous cryoablation probes were advanced into the left renal mass. Probe positioning was confirmed by CT prior to cryoablation. Hydrodissection was then performed between the lateral aspect of the mass and adjacent descending colon via a 22 gauge spinal needle. A mixture of 250 mL of sterile saline with 5 mL of Omnipaque 300 contrast was utilized. Approximately 200 mL of hydrodissection fluid was injected during the procedure in order to displace the colon laterally and provide a buffer of space during cryoablation to protect the colon from injury. Cryoablation was performed through the 2 probes simultaneously. Initial 10 minute cycle of cryoablation was performed. This was followed by a 8 minute thaw cycle. A second 10 minute cycle of cryoablation was then performed. During ablation, periodic CT imaging was performed to monitor ice ball formation and morphology. After active thaw, a 30 second cautery cycle was performed. The cryoablation probes were then removed. COMPLICATIONS: None FINDINGS: The predominately exophytic lateral interpolar/lower pole mass of the left kidney was well visualized without contrast just superior to an exophytic cyst. This mass is partially calcified internally by CT and measures approximately 2.3 x 3.0 cm on axial images. Solid tissue was obtained with biopsy. Hydrodissection was successful in displacing the colon laterally and creating adequate space between the lateral margin of the mass and the colon for ablation. During cryoablation, CT demonstrates adequate ice ball formation encompassing the mass. IMPRESSION: CT  guided core biopsy and cryoablation of left renal mass. The patient will be observed overnight. Electronically Signed   By: Aletta Edouard M.D.   On: 02/02/2019 15:30   [facility] Radiologist Eval & Mgmt  Result Date: 01/12/2019 Please refer to notes tab for details about interventional procedure. (Op Note)   Treatments: Cryoablation of left renal mass  Discharge Exam: Blood pressure 121/76, pulse 76, temperature (!) 97.5 F (36.4 C), temperature source Oral, resp. rate 16, height 5\' 4"  (1.626 m), weight 161 lb 4 oz (73.1 kg), SpO2 95 %. Physical Exam Vitals signs and nursing note reviewed.  Constitutional:      General: She is not in acute distress.    Appearance: Normal appearance.  Cardiovascular:     Rate and Rhythm: Normal rate and regular rhythm.     Heart sounds: Normal heart sounds. No murmur.  Pulmonary:     Effort: Pulmonary effort is normal. No respiratory distress.     Breath sounds: Normal breath sounds. No wheezing.  Abdominal:     Comments: Left flank incisions soft without tenderness, erythema, drainage, or active bleeding.  Skin:    General: Skin is warm and dry.  Neurological:     Mental Status: She is alert and oriented to person, place, and time.  Psychiatric:        Mood and Affect: Mood normal.        Behavior: Behavior normal.        Thought Content: Thought content normal.        Judgment: Judgment normal.     Disposition: Discharge disposition: 01-Home or Self Care       Discharge Instructions    Call MD for:  difficulty breathing, headache or visual disturbances   Complete by: As directed    Call MD for:  extreme fatigue   Complete by: As directed    Call MD for:  hives   Complete by: As directed    Call MD for:  persistant dizziness or light-headedness   Complete by: As directed    Call MD for:  persistant nausea and vomiting   Complete by: As directed    Call MD for:  redness, tenderness, or signs of infection (pain, swelling, redness, odor  or green/yellow discharge around incision site)   Complete by: As directed    Call MD for:  severe uncontrolled pain   Complete by: As directed    Call MD for:  temperature >100.4   Complete by: As directed    Diet - low sodium heart healthy   Complete by: As directed    Discharge instructions   Complete by: As directed    Do not shower for 48 hours post-procedure. Keep bandage on for first shower, immediately remove bandage after shower and pat area dry. No additional dressing changes needed after this. Do not submerge (swimming, bathing) for 7 days post-procedure. Hematuria should subside in a few days, drink plenty of water to help with this. Do not restart Eliquis until 02/05/2019 unless hematuria visibly worsens. Call our office if hematuria worsens.   Increase activity slowly   Complete by: As directed    Remove dressing in 24 hours   Complete by: As directed    Following shower. See discharge instructions above for further information.     Allergies as of 02/03/2019   No Known Allergies     Medication List    TAKE these medications   acetaminophen 500 MG tablet Commonly known as: TYLENOL Take 500  mg by mouth daily after breakfast.   Biotin 5000 MCG Tabs Take 5,000 mcg by mouth every evening.   carvedilol 25 MG tablet Commonly known as: COREG Take 25 mg by mouth 2 (two) times daily.   CoQ-10 100 MG Caps Take 100 mg by mouth every evening.   Eliquis 5 MG Tabs tablet Generic drug: apixaban Take 5 mg by mouth 2 (two) times daily.   furosemide 40 MG tablet Commonly known as: LASIX Take 40 mg by mouth every other day. In the morning.   loratadine 10 MG tablet Commonly known as: CLARITIN Take 10 mg by mouth daily.   Magnesium 250 MG Tabs Take 250 mg by mouth daily.   multivitamin with minerals Tabs tablet Take 1 tablet by mouth every evening. Centrum   omeprazole 20 MG capsule Commonly known as: PRILOSEC Take 20 mg by mouth every evening.   Red Yeast  Rice 600 MG Tabs Take 600 mg by mouth every evening.   Theratears 0.25 % Soln Generic drug: Carboxymethylcellulose Sodium Place 1 drop into both eyes 3 (three) times daily as needed (runny eyes.).   Turmeric 500 MG Tabs Take 500 mg by mouth every evening.      Follow-up Information    Aletta Edouard, MD Follow up in 3 week(s).   Specialties: Interventional Radiology, Radiology Why: Please follow-up with Dr. Kathlene Cote for tele-visit 3-4 weeks after discharge. Our office will call you to set up this appointment. Contact information: [address] [place] [address] [phone]            Electronically Signed: Earley Abide, PA-C 02/03/2019, 2:47 PM   I have spent Greater Than 30 Minutes discharging Alicia Cole.

## 2019-02-22 ENCOUNTER — Encounter: Payer: Self-pay | Admitting: *Deleted

## 2019-02-22 ENCOUNTER — Ambulatory Visit
Admission: RE | Admit: 2019-02-22 | Discharge: 2019-02-22 | Disposition: A | Discharge status: Home / Self Care | Payer: Medicare Other | Source: Ambulatory Visit | Attending: Student | Admitting: Student

## 2019-02-22 ENCOUNTER — Other Ambulatory Visit: Payer: Self-pay

## 2019-02-22 DIAGNOSIS — N2889 Other specified disorders of kidney and ureter: Secondary | ICD-10-CM

## 2019-02-22 HISTORY — PX: IR RADIOLOGIST EVAL & MGMT: IMG5224

## 2019-02-22 NOTE — Progress Notes (Signed)
Chief Complaint: Patient was consulted remotely today (TeleHealth) for follow up after renal cryoablation.  History of Present Illness: Alicia Cole is a 85 y.o. female status post cryoablation of a 3 cm left renal mass on 02/02/2019.  Biopsy at the time of the procedure demonstrated clear cell carcinoma, WHO grade 2.  [name] tolerated the procedure well and is currently without any complaints other than chronic fatigue.  She has no urinary symptoms.  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 restrictions on lt arm   CHOLECYSTECTOMY  1994   FRACTURE SURGERY Right 2005   leg. with plate   IR RADIOLOGIST EVAL & MGMT  01/12/2019   NASAL SINUS SURGERY Bilateral 2017   RADIOLOGY WITH ANESTHESIA N/A 02/02/2019   Procedure: CT RENAL CRYOABLATION AND BIOPSY;  Surgeon: Aletta Edouard, MD;  Location: [facility];  Service: Radiology;  Laterality: N/A;   ROTATOR CUFF REPAIR Bilateral [id number]    Allergies: Patient has no known allergies.  Medications: Prior to Admission medications   Medication Sig Start Date End Date Taking? Authorizing Provider  acetaminophen (TYLENOL) 500 MG tablet Take 500 mg by mouth daily after breakfast.    [provider]  Biotin 5000 MCG TABS Take 5,000 mcg by mouth every evening.    [provider]  Carboxymethylcellulose Sodium (THERATEARS) 0.25 % SOLN Place 1 drop into both eyes 3 (three) times daily as needed (runny eyes.).    [provider]  carvedilol (COREG) 25 MG tablet Take 25 mg by mouth 2 (two)  times daily. 11/19/18   [provider]  Coenzyme Q10 (COQ-10) 100 MG CAPS Take 100 mg by mouth every evening.    [provider]  ELIQUIS 5 MG TABS tablet Take 5 mg by mouth 2 (two) times daily. 11/19/18   [provider]  furosemide (LASIX) 40 MG tablet Take 40 mg by mouth every other day. In the morning. 11/19/18   [provider]  loratadine (CLARITIN) 10 MG tablet Take 10 mg by mouth daily.    [provider]  Magnesium 250 MG TABS Take 250 mg by mouth daily.    [provider]  Multiple Vitamin (MULTIVITAMIN WITH MINERALS) TABS tablet Take 1 tablet by mouth every evening. Centrum    [provider]  omeprazole (PRILOSEC) 20 MG capsule Take 20 mg by mouth every evening. 11/19/18   [provider]  Red Yeast Rice 600 MG TABS Take 600 mg by mouth every evening.    [provider]  Turmeric 500 MG TABS Take 500 mg by mouth every evening.    [provider]     No family history on file.  Social History   Socioeconomic History   Marital status: Married    Spouse name: Not on file   Number of children: Not on file   Years of education: Not on file   Highest education level: Not on file  Occupational History  Not on file  Social Needs   Financial resource strain: Not on file   Food insecurity    Worry: Not on file    Inability: Not on file   Transportation needs    Medical: Not on file    Non-medical: Not on file  Tobacco Use   Smoking status: Former Smoker    Packs/day: 0.50    Years: 10.00    Pack years: 5.00    Types: Cigarettes    Quit date: 03/24/1973    Years since quitting: 45.9   Smokeless tobacco: Never Used  Substance and Sexual Activity   Alcohol use: Never    Frequency: Never   Drug use: Never   Sexual activity: Not on file  Lifestyle   Physical activity    Days per week: Not on file    Minutes per session: Not on file   Stress: Not on file  Relationships     Social connections    Talks on phone: Not on file    Gets together: Not on file    Attends religious service: Not on file    Active member of club or organization: Not on file    Attends meetings of clubs or organizations: Not on file    Relationship status: Not on file  Other Topics Concern   Not on file  Social History Narrative   Husband has memory issues.   Daughter passed away from [place] in 05/31/18    ECOG Status: 0 - Asymptomatic  Review of Systems  Constitutional: Positive for fatigue.  Respiratory: Negative.   Cardiovascular: Negative.   Gastrointestinal: Negative.   Genitourinary: Negative.   Musculoskeletal: Negative.   Neurological: Negative.     Review of Systems: A 12 point ROS discussed and pertinent positives are indicated in the HPI above.  All other systems are negative.  Physical Exam No direct physical exam was performed (except for noted visual exam findings with Video Visits).   Vital Signs: There were no vitals taken for this visit.  Imaging: Dg Chest 1 View  Result Date: 02/02/2019 CLINICAL DATA:  85 year old female with preop evaluation. EXAM: CHEST  1 VIEW COMPARISON:  Chest radiograph dated 10/18/2013 FINDINGS: There is no focal consolidation, pleural effusion, or pneumothorax. Probable small partially calcified granuloma at the right lung base. The cardiac silhouette is within normal limits. Atherosclerotic calcification of the aortic arch. Multiple surgical clips noted over the left chest. No acute osseous pathology. IMPRESSION: No acute cardiopulmonary process. Electronically Signed   By: Anner Crete M.D.   On: 02/02/2019 08:12   Ct Guide Tissue Ablation  Result Date: 02/02/2019 CLINICAL DATA:  2.9 cm left renal mass suspicious for renal carcinoma by imaging. The patient presents for biopsy and percutaneous ablation the mass. EXAM: CT-GUIDED CORE BIOPSY OF LEFT RENAL MASS CT-GUIDED PERCUTANEOUS CRYOABLATION OF LEFT RENAL MASS  ANESTHESIA/SEDATION: General MEDICATIONS: 2 g IV Ancef. The antibiotic was administered in an appropriate time interval prior to needle puncture of the skin. CONTRAST:  None PROCEDURE: The procedure, risks, benefits, and alternatives were explained to the patient. Questions regarding the procedure were encouraged and answered. The patient understands and consents to the procedure. A time-out was performed prior to initiating the procedure. The patient was placed under general anesthesia. Initial unenhanced CT was performed in a prone position to localize the left kidney. The left flank region was prepped with chlorhexidine in a sterile fashion, and a sterile drape was applied covering the operative field. A sterile gown  and sterile gloves were used for the procedure. A 17 gauge trocar needle was advanced into the left renal mass. Core biopsy was performed with an 18 gauge automated core biopsy device. A core sample was submitted in formalin for pathologic analysis. Under CT guidance, 2 separate [employer] CX percutaneous cryoablation probes were advanced into the left renal mass. Probe positioning was confirmed by CT prior to cryoablation. Hydrodissection was then performed between the lateral aspect of the mass and adjacent descending colon via a 22 gauge spinal needle. A mixture of 250 mL of sterile saline with 5 mL of Omnipaque 300 contrast was utilized. Approximately 200 mL of hydrodissection fluid was injected during the procedure in order to displace the colon laterally and provide a buffer of space during cryoablation to protect the colon from injury. Cryoablation was performed through the 2 probes simultaneously. Initial 10 minute cycle of cryoablation was performed. This was followed by a 8 minute thaw cycle. A second 10 minute cycle of cryoablation was then performed. During ablation, periodic CT imaging was performed to monitor ice ball formation and morphology. After active thaw, a 30 second cautery  cycle was performed. The cryoablation probes were then removed. COMPLICATIONS: None FINDINGS: The predominately exophytic lateral interpolar/lower pole mass of the left kidney was well visualized without contrast just superior to an exophytic cyst. This mass is partially calcified internally by CT and measures approximately 2.3 x 3.0 cm on axial images. Solid tissue was obtained with biopsy. Hydrodissection was successful in displacing the colon laterally and creating adequate space between the lateral margin of the mass and the colon for ablation. During cryoablation, CT demonstrates adequate ice ball formation encompassing the mass. IMPRESSION: CT guided core biopsy and cryoablation of left renal mass. The patient will be observed overnight. Electronically Signed   By: Aletta Edouard M.D.   On: 02/02/2019 15:30   Ct Biopsy  Result Date: 02/02/2019 CLINICAL DATA:  2.9 cm left renal mass suspicious for renal carcinoma by imaging. The patient presents for biopsy and percutaneous ablation the mass. EXAM: CT-GUIDED CORE BIOPSY OF LEFT RENAL MASS CT-GUIDED PERCUTANEOUS CRYOABLATION OF LEFT RENAL MASS ANESTHESIA/SEDATION: General MEDICATIONS: 2 g IV Ancef. The antibiotic was administered in an appropriate time interval prior to needle puncture of the skin. CONTRAST:  None PROCEDURE: The procedure, risks, benefits, and alternatives were explained to the patient. Questions regarding the procedure were encouraged and answered. The patient understands and consents to the procedure. A time-out was performed prior to initiating the procedure. The patient was placed under general anesthesia. Initial unenhanced CT was performed in a prone position to localize the left kidney. The left flank region was prepped with chlorhexidine in a sterile fashion, and a sterile drape was applied covering the operative field. A sterile gown and sterile gloves were used for the procedure. A 17 gauge trocar needle was advanced into the left  renal mass. Core biopsy was performed with an 18 gauge automated core biopsy device. A core sample was submitted in formalin for pathologic analysis. Under CT guidance, 2 separate [employer] CX percutaneous cryoablation probes were advanced into the left renal mass. Probe positioning was confirmed by CT prior to cryoablation. Hydrodissection was then performed between the lateral aspect of the mass and adjacent descending colon via a 22 gauge spinal needle. A mixture of 250 mL of sterile saline with 5 mL of Omnipaque 300 contrast was utilized. Approximately 200 mL of hydrodissection fluid was injected during the procedure in order to  displace the colon laterally and provide a buffer of space during cryoablation to protect the colon from injury. Cryoablation was performed through the 2 probes simultaneously. Initial 10 minute cycle of cryoablation was performed. This was followed by a 8 minute thaw cycle. A second 10 minute cycle of cryoablation was then performed. During ablation, periodic CT imaging was performed to monitor ice ball formation and morphology. After active thaw, a 30 second cautery cycle was performed. The cryoablation probes were then removed. COMPLICATIONS: None FINDINGS: The predominately exophytic lateral interpolar/lower pole mass of the left kidney was well visualized without contrast just superior to an exophytic cyst. This mass is partially calcified internally by CT and measures approximately 2.3 x 3.0 cm on axial images. Solid tissue was obtained with biopsy. Hydrodissection was successful in displacing the colon laterally and creating adequate space between the lateral margin of the mass and the colon for ablation. During cryoablation, CT demonstrates adequate ice ball formation encompassing the mass. IMPRESSION: CT guided core biopsy and cryoablation of left renal mass. The patient will be observed overnight. Electronically Signed   By: Aletta Edouard M.D.   On: 02/02/2019 15:30     Labs:  CBC: Recent Labs    01/28/19 1459 02/03/19 0449  WBC 8.9 13.5*  HGB 12.7 10.6*  HCT 39.7 32.8*  PLT 266 190    COAGS: Recent Labs    01/28/19 1459  INR 1.2    BMP: Recent Labs    01/28/19 1459 02/02/19 0735 02/03/19 0449  NA 141 138 132*  K 4.3 4.0 3.7  CL 104 103 100  CO2 30 27 23   GLUCOSE 114* 112* 117*  BUN 21 21 27*  CALCIUM 9.8 9.8 8.4*  CREATININE 1.24* 1.30* 1.42*  GFRNONAA 40* 37* 34*  GFRAA 46* 43* 39*    Assessment and Plan:  Mrs. Gebel is doing well after percutaneous cryoablation of a biopsy-proven clear cell carcinoma of the left kidney.  After overnight observation, creatinine did increase slightly from 1.30 before the procedure to 1.42 the morning after the procedure.  I recommended a follow-up MRI in February, 3 months after the procedure.  We will make sure to check renal function before that time in order to determine if she can get contrast for the MRI study.   Electronically Signed: Azzie Roup 02/22/2019, 1:18 PM     I spent a total of 10 Minutes in remote  clinical consultation, greater than 50% of which was counseling/coordinating care post cryoablation of a left renal carcinoma.    Visit type: Audio only (telephone). Audio (no video) only due to patient's lack of internet/smartphone capability. Alternative for in-person consultation at [facility], [address], [place], [place]. This visit type was conducted due to national recommendations for restrictions regarding the COVID-19 Pandemic (e.g. social distancing).  This format is felt to be most appropriate for this patient at this time.  All issues noted in this document were discussed and addressed.

## 2019-05-10 ENCOUNTER — Other Ambulatory Visit: Payer: Self-pay | Admitting: *Deleted

## 2019-05-10 ENCOUNTER — Other Ambulatory Visit: Payer: Self-pay | Admitting: Interventional Radiology

## 2019-05-10 DIAGNOSIS — N2889 Other specified disorders of kidney and ureter: Secondary | ICD-10-CM

## 2019-05-10 DIAGNOSIS — C642 Malignant neoplasm of left kidney, except renal pelvis: Secondary | ICD-10-CM

## 2019-05-26 ENCOUNTER — Other Ambulatory Visit: Payer: Self-pay | Admitting: Interventional Radiology

## 2019-05-26 ENCOUNTER — Ambulatory Visit
Admission: RE | Admit: 2019-05-26 | Discharge: 2019-05-26 | Disposition: A | Discharge status: Home / Self Care | Payer: Self-pay | Source: Ambulatory Visit | Attending: Interventional Radiology | Admitting: Interventional Radiology

## 2019-05-26 DIAGNOSIS — N2889 Other specified disorders of kidney and ureter: Secondary | ICD-10-CM

## 2019-06-01 ENCOUNTER — Other Ambulatory Visit: Payer: Self-pay

## 2019-06-01 ENCOUNTER — Encounter: Payer: Self-pay | Admitting: *Deleted

## 2019-06-01 ENCOUNTER — Ambulatory Visit
Admission: RE | Admit: 2019-06-01 | Discharge: 2019-06-01 | Disposition: A | Discharge status: Home / Self Care | Payer: Medicare Other | Source: Ambulatory Visit | Attending: Interventional Radiology | Admitting: Interventional Radiology

## 2019-06-01 DIAGNOSIS — N2889 Other specified disorders of kidney and ureter: Secondary | ICD-10-CM

## 2019-06-01 HISTORY — PX: IR RADIOLOGIST EVAL & MGMT: IMG5224

## 2019-06-01 NOTE — Progress Notes (Signed)
Chief Complaint: Patient was consulted remotely today (TeleHealth) for follow-up after cryoablation of a left renal carcinoma on 02/02/2019.  History of Present Illness: Alicia Cole is a 86 y.o. 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  She tolerated the procedure well and is currently without any complaints.  She denies any urinary symptoms.  Follow-up is performed today to review and discuss recent follow-up MRI findings.  Past Medical History:  Diagnosis Date  . Arthritis    hands,knee,lower back  . Atrial fibrillation ([place])   . 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 Chronic kidney disease    stage 3  . Dysrhythmia 2017   A Fib  . GERD (gastroesophageal reflux disease)   . Hypertension   . Sleep apnea    uses C-Pap    Past Surgical History:  Procedure Laterality Date  . ABDOMINAL HYSTERECTOMY  1984  . BREAST SURGERY Left 2014    Lumpectomy. restrictions on lt arm  . CHOLECYSTECTOMY  1994  . FRACTURE SURGERY Right 2005   leg. with plate  . IR RADIOLOGIST EVAL & MGMT  01/12/2019  . IR RADIOLOGIST EVAL & MGMT  02/22/2019  . NASAL SINUS SURGERY Bilateral 2017  . RADIOLOGY WITH ANESTHESIA N/A 02/02/2019   Procedure: CT RENAL CRYOABLATION AND BIOPSY;  Surgeon: Aletta Edouard, MD;  Location: [facility];  Service: Radiology;  Laterality: N/A;  . ROTATOR CUFF REPAIR Bilateral [id number]    Allergies: Patient has no known allergies.  Medications: Prior to Admission medications   Medication Sig Start Date End Date Taking? Authorizing Provider  acetaminophen (TYLENOL) 500 MG tablet Take 500 mg by mouth daily after breakfast.    [provider]  Biotin 5000 MCG TABS Take 5,000 mcg by mouth every evening.    [provider]  Carboxymethylcellulose Sodium (THERATEARS) 0.25 % SOLN Place 1 drop into both eyes 3 (three) times daily as needed (runny eyes.).    [provider]  carvedilol (COREG) 25 MG tablet Take 25 mg by mouth 2 (two) times daily. 11/19/18   [provider]  Coenzyme Q10 (COQ-10) 100 MG CAPS Take 100 mg by mouth every evening.    [provider]  ELIQUIS 5 MG TABS tablet Take 5 mg by mouth 2 (two) times daily. 11/19/18   [provider]  furosemide (LASIX) 40 MG tablet Take 40 mg by mouth every other day. In the morning. 11/19/18   [provider]  loratadine (CLARITIN) 10 MG tablet Take 10 mg by mouth daily.    [provider]  Magnesium 250 MG TABS Take 250 mg by mouth daily.    [provider]  Multiple Vitamin (MULTIVITAMIN WITH MINERALS) TABS tablet Take 1 tablet by mouth every evening. Centrum    [provider]  omeprazole (PRILOSEC) 20 MG capsule Take 20 mg by mouth every evening. 11/19/18   [provider]  Red Yeast Rice 600 MG TABS Take 600 mg by mouth every evening.    [provider]  Turmeric 500 MG TABS Take 500 mg by mouth every evening.    [provider]     No family history on file.  Social History   Socioeconomic History  . Marital status: Married    Spouse name: Not on file  . Number of children: Not on file  . Years of education: Not on file  .  Highest education level: Not on file  Occupational History  . Not on file  Tobacco Use  . Smoking status: Former Smoker    Packs/day: 0.50    Years: 10.00    Pack years: 5.00    Types: Cigarettes    Quit date: 03/24/1973    Years since quitting: 46.2  . Smokeless tobacco: Never Used  Substance and Sexual Activity  . Alcohol use: Never  . Drug use: Never  . Sexual activity: Not on file  Other Topics Concern  . Not on file  Social History Narrative   Husband has memory issues.   Daughter passed away from [place] in 31-May-2018   Social Determinants of Health   Financial Resource Strain:   . Difficulty of Paying Living Expenses: Not on file  Food Insecurity:   . Worried  About Charity fundraiser in the Last Year: Not on file  . Ran Out of Food in the Last Year: Not on file  Transportation Needs:   . Lack of Transportation (Medical): Not on file  . Lack of Transportation (Non-Medical): Not on file  Physical Activity:   . Days of Exercise per Week: Not on file  . Minutes of Exercise per Session: Not on file  Stress:   . Feeling of Stress : Not on file  Social Connections:   . Frequency of Communication with Friends and Family: Not on file  . Frequency of Social Gatherings with Friends and Family: Not on file  . Attends Religious Services: Not on file  . Active Member of Clubs or Organizations: Not on file  . Attends Archivist Meetings: Not on file  . Marital Status: Not on file    ECOG Status: 0 - Asymptomatic  Review of Systems  Constitutional: Negative.   Respiratory: Negative.   Cardiovascular: Negative.   Gastrointestinal: Negative.   Genitourinary: Negative.   Musculoskeletal: Negative.   Neurological: Negative.     Review of Systems: A 12 point ROS discussed and pertinent positives are indicated in the HPI above.  All other systems are negative.  Physical Exam No direct physical exam was performed (except for noted visual exam findings with Video Visits).   Vital Signs: There were no vitals taken for this visit.  Imaging: No results found.  Labs:  CBC: Recent Labs    01/28/19 1459 02/03/19 0449  WBC 8.9 13.5*  HGB 12.7 10.6*  HCT 39.7 32.8*  PLT 266 190    COAGS: Recent Labs    01/28/19 1459  INR 1.2    BMP: Recent Labs    01/28/19 1459 02/02/19 0735 02/03/19 0449  NA 141 138 132*  K 4.3 4.0 3.7  CL 104 103 100  CO2 30 27 23   GLUCOSE 114* 112* 117*  BUN 21 21 27*  CALCIUM 9.8 9.8 8.4*  CREATININE 1.24* 1.30* 1.42*  GFRNONAA 40* 37* 34*  GFRAA 46* 43* 39*    Assessment and Plan:  I spoke with Alicia Cole and reviewed MRI findings with her.  A follow-up MRI was performed at [facility] on 05/20/2019 and is compared to the 10/12/2018 study.  This demonstrates a well-circumscribed ablation defect surrounding the ablated left lower pole renal carcinoma with no evidence of internal contrast enhancement.  There is no evidence of complication following the procedure.  No new renal lesions are identified.  Bilateral Bosniak 1, 2 and 57F cysts are stable in appearance.  Given her significant chronic kidney disease and excellent appearance  of the ablation defect currently by MRI 3 months after ablation, I recommended that we wait 12 months to perform another follow-up MRI to limit gadolinium exposure over time.  She is agreeable to this plan and we will have her obtain another scan at [facility] in approximately late February of next year.   Electronically Signed: Azzie Roup 06/01/2019, 8:51 AM     I spent a total of 10 Minutes in remote  clinical consultation, greater than 50% of which was counseling/coordinating care post ablation of a left renal carcinoma.   Visit type: Audio only (telephone). Audio (no video) only due to patient's lack of internet/smartphone capability. Alternative for in-person consultation at [facility], [address], [place], [place]. This visit type was conducted due to national recommendations for restrictions regarding the COVID-19 Pandemic (e.g. social distancing).  This format is felt to be most appropriate for this patient at this time.  All issues noted in this document were discussed and addressed.

## 2020-04-17 ENCOUNTER — Other Ambulatory Visit: Payer: Self-pay

## 2020-04-17 ENCOUNTER — Other Ambulatory Visit: Payer: Self-pay | Admitting: Interventional Radiology

## 2020-04-17 DIAGNOSIS — N2889 Other specified disorders of kidney and ureter: Secondary | ICD-10-CM

## 2020-05-23 IMAGING — CT CT GUIDANCE TISSUE ABLATION
3 of 7 series · 16 of 32 positions shown, 18 images · non-contrast
Comparison: none

CLINICAL DATA: 2.9 cm left renal mass suspicious for renal
carcinoma by imaging. The patient presents for biopsy and
percutaneous ablation the mass.

[Series 2: i-spiral 2.0 b31f · axial · 0.98mm/px · z∈[+1471,+1507]mm · 2 of 92 slices shown (1 of 3)]
[im 19/92  lung]
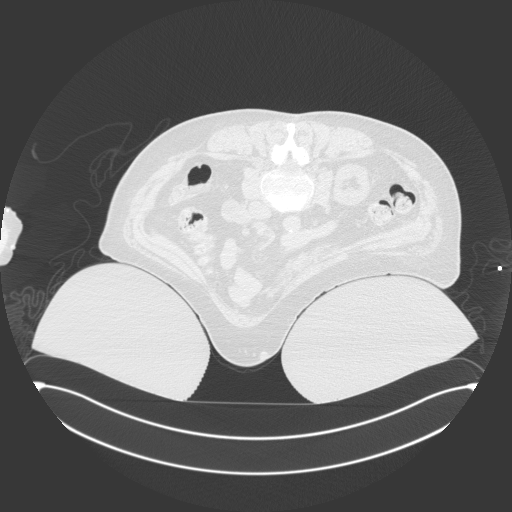
[im 37/92  lung]
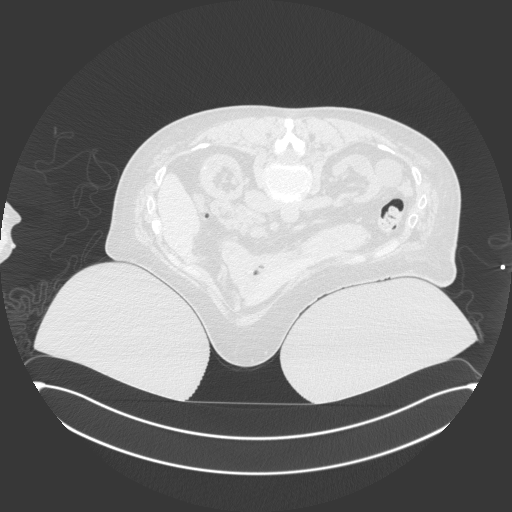

[Series 4: i-spiral 2.0 b31f · axial · 0.98mm/px · z∈[+1471,+1621]mm · 7 of 106 slices shown, 9 images (2 of 3)]
[im 16/106  mediastinal]
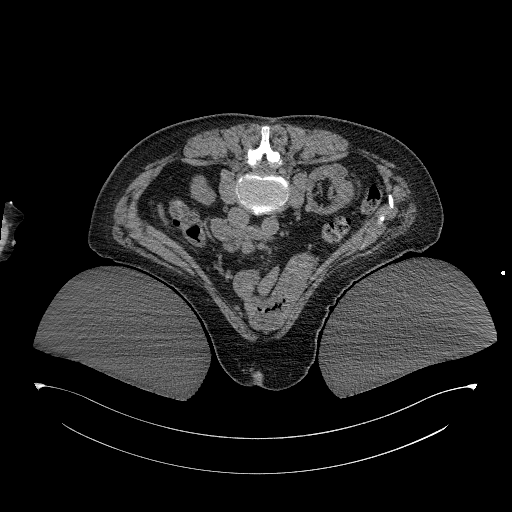
[im 16/106  lung]
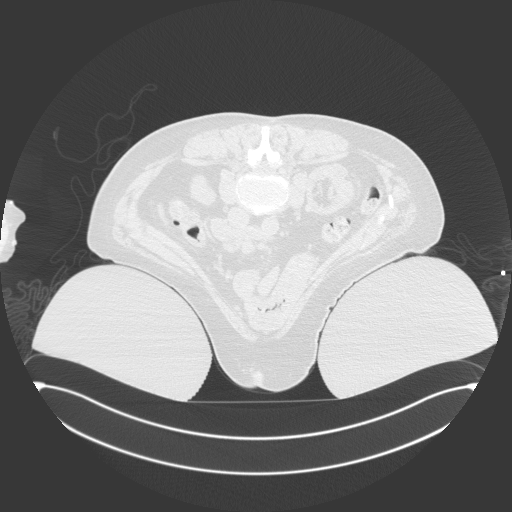
[im 31/106  lung]
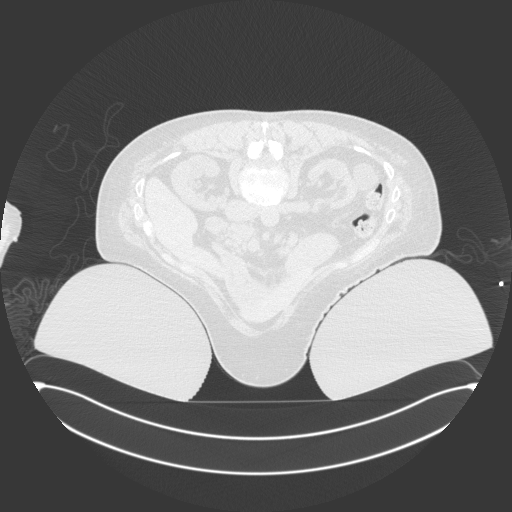
[im 46/106  lung]
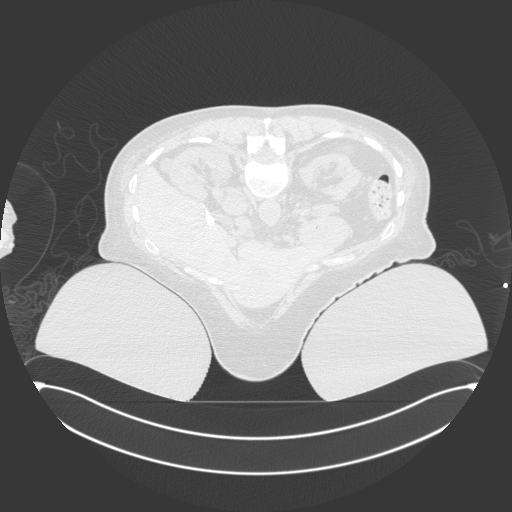
[im 48/106  lung]
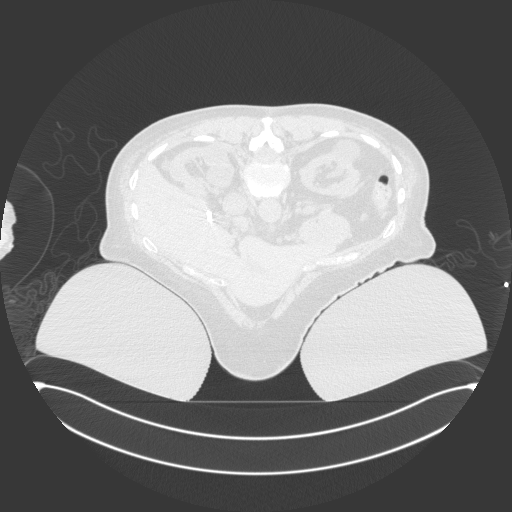
[im 61/106  mediastinal]
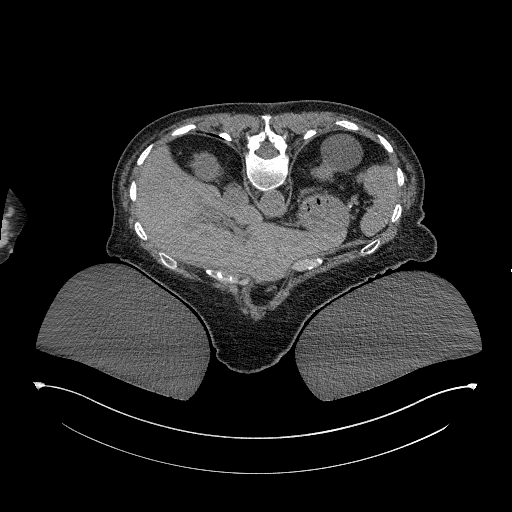
[im 61/106  lung]
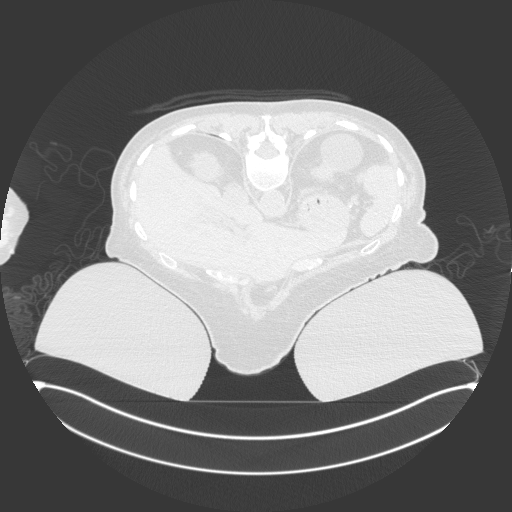
[im 76/106  lung]
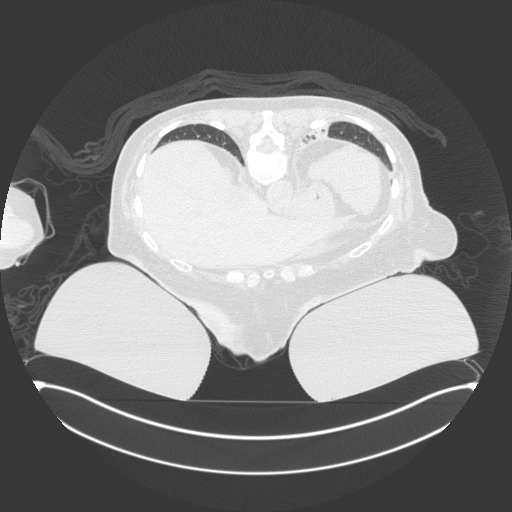
[im 91/106  lung]
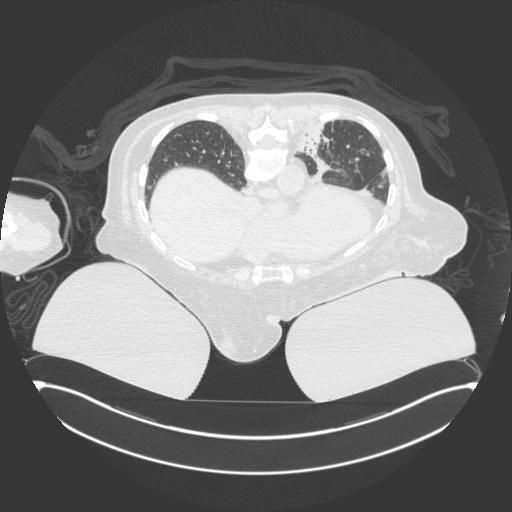

[Series 6: i-spiral 2.0 b31f · axial · 0.98mm/px · z∈[+1469,+1619]mm · 7 of 107 slices shown (3 of 3)]
[im 16/107  lung]
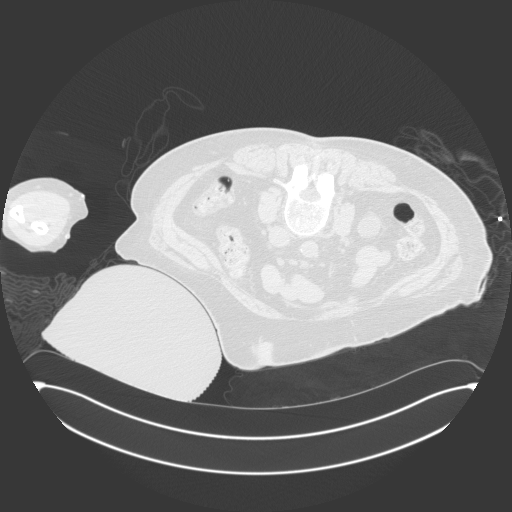
[im 31/107  lung]
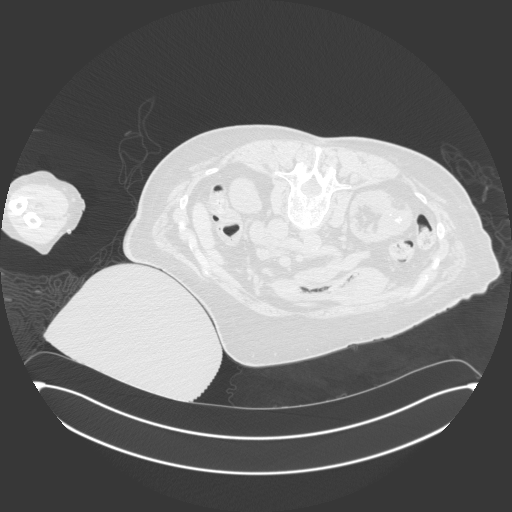
[im 46/107  lung]
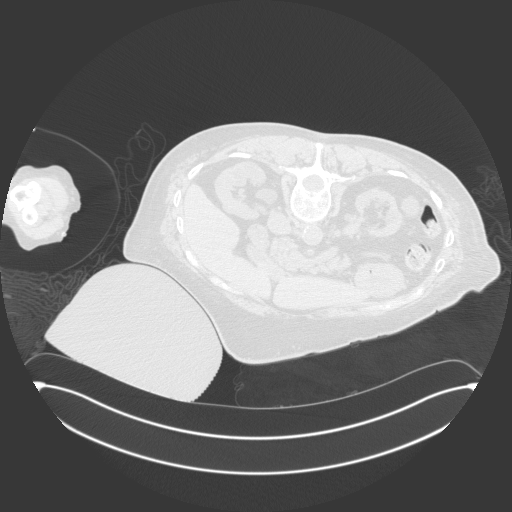
[im 49/107  lung]
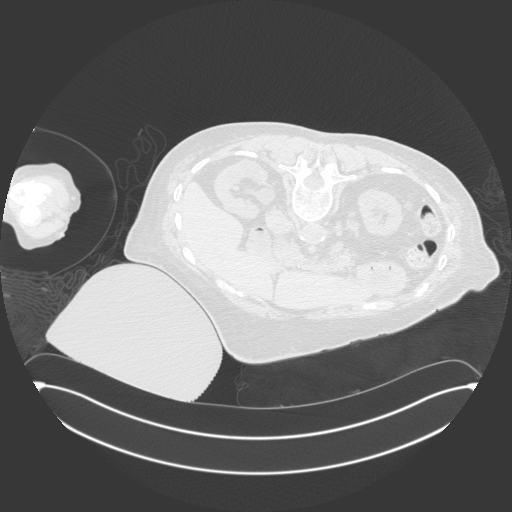
[im 61/107  lung]
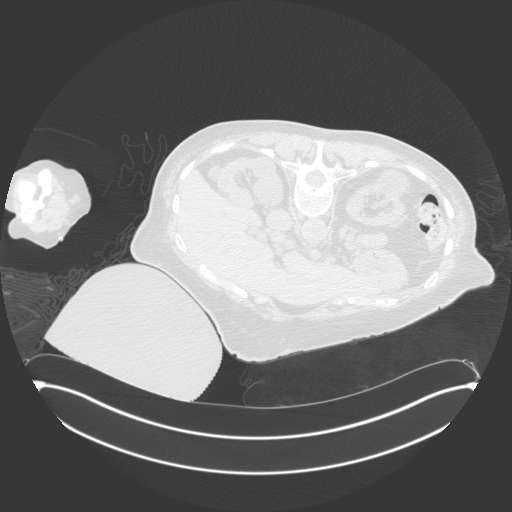
[im 76/107  lung]
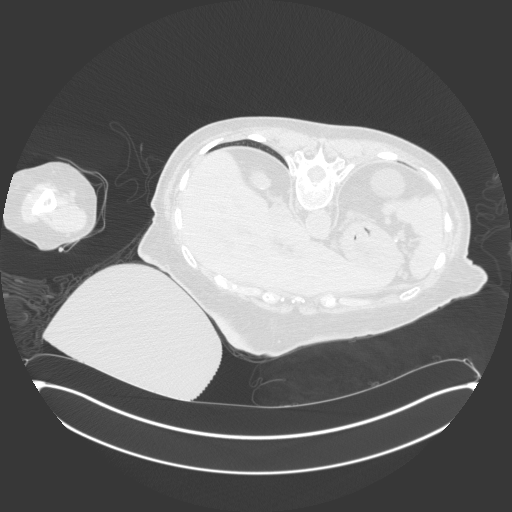
[im 91/107  lung]
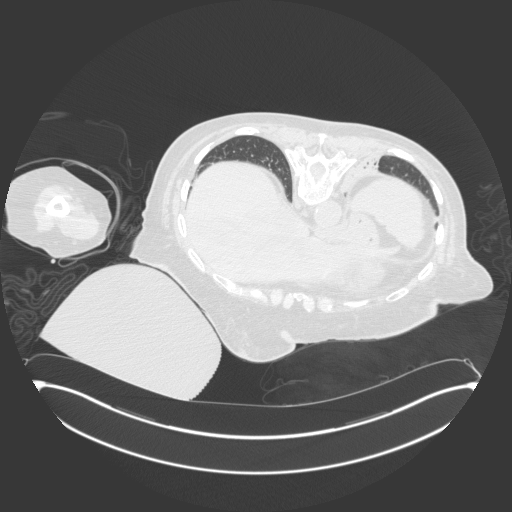

[16 of 32 positions shown; findings below may reference images not displayed]

EXAM:
CT-GUIDED CORE BIOPSY OF LEFT RENAL MASS

CT-GUIDED PERCUTANEOUS CRYOABLATION OF LEFT RENAL MASS

ANESTHESIA/SEDATION:
General

MEDICATIONS:
2 g IV Ancef. The antibiotic was administered in an appropriate time
interval prior to needle puncture of the skin.

CONTRAST:  None

PROCEDURE:
The procedure, risks, benefits, and alternatives were explained to
the patient. Questions regarding the procedure were encouraged and
answered. The patient understands and consents to the procedure. A
time-out was performed prior to initiating the procedure.

The patient was placed under general anesthesia. Initial unenhanced
CT was performed in a prone position to localize the left kidney.

The left flank region was prepped with chlorhexidine in a sterile
fashion, and a sterile drape was applied covering the operative
field. A sterile gown and sterile gloves were used for the
procedure.

A 17 gauge trocar needle was advanced into the left renal mass. Core
biopsy was performed with an 18 gauge automated core biopsy device.
A core sample was submitted in formalin for pathologic analysis.

Under CT guidance, 2 separate [employer] percutaneous
cryoablation probes were advanced into the left renal mass. Probe
positioning was confirmed by CT prior to cryoablation.
Hydrodissection was then performed between the lateral aspect of the
mass and adjacent descending colon via a 22 gauge spinal needle. A
mixture of 250 mL of sterile saline with 5 mL of Omnipaque 300
contrast was utilized. Approximately 200 mL of hydrodissection fluid
was injected during the procedure in order to displace the colon
laterally and provide a buffer of space during cryoablation to
protect the colon from injury.

Cryoablation was performed through the 2 probes simultaneously.
Initial 10 minute cycle of cryoablation was performed. This was
followed by a 8 minute thaw cycle. A second 10 minute cycle of
cryoablation was then performed. During ablation, periodic CT
imaging was performed to monitor ice ball formation and morphology.
After active thaw, a 30 second cautery cycle was performed. The
cryoablation probes were then removed.

COMPLICATIONS:
None
FINDINGS: The predominately exophytic lateral interpolar/lower pole mass of
the left kidney was well visualized without contrast just superior
to an exophytic cyst. This mass is partially calcified internally by
CT and measures approximately 2.3 x 3.0 cm on axial images.

Solid tissue was obtained with biopsy. Hydrodissection was
successful in displacing the colon laterally and creating adequate
space between the lateral margin of the mass and the colon for
ablation. During cryoablation, CT demonstrates adequate ice ball
formation encompassing the mass.
IMPRESSION: CT guided core biopsy and cryoablation of left renal mass. The
patient will be observed overnight.

## 2020-05-23 IMAGING — CR DG CHEST 1V
1 series · 1 of 1 positions shown · non-contrast
Comparison: Chest radiograph dated 10/18/2013

CLINICAL DATA: 87-year-old female with preop evaluation.

EXAM:
CHEST  1 VIEW

[w chest pa]
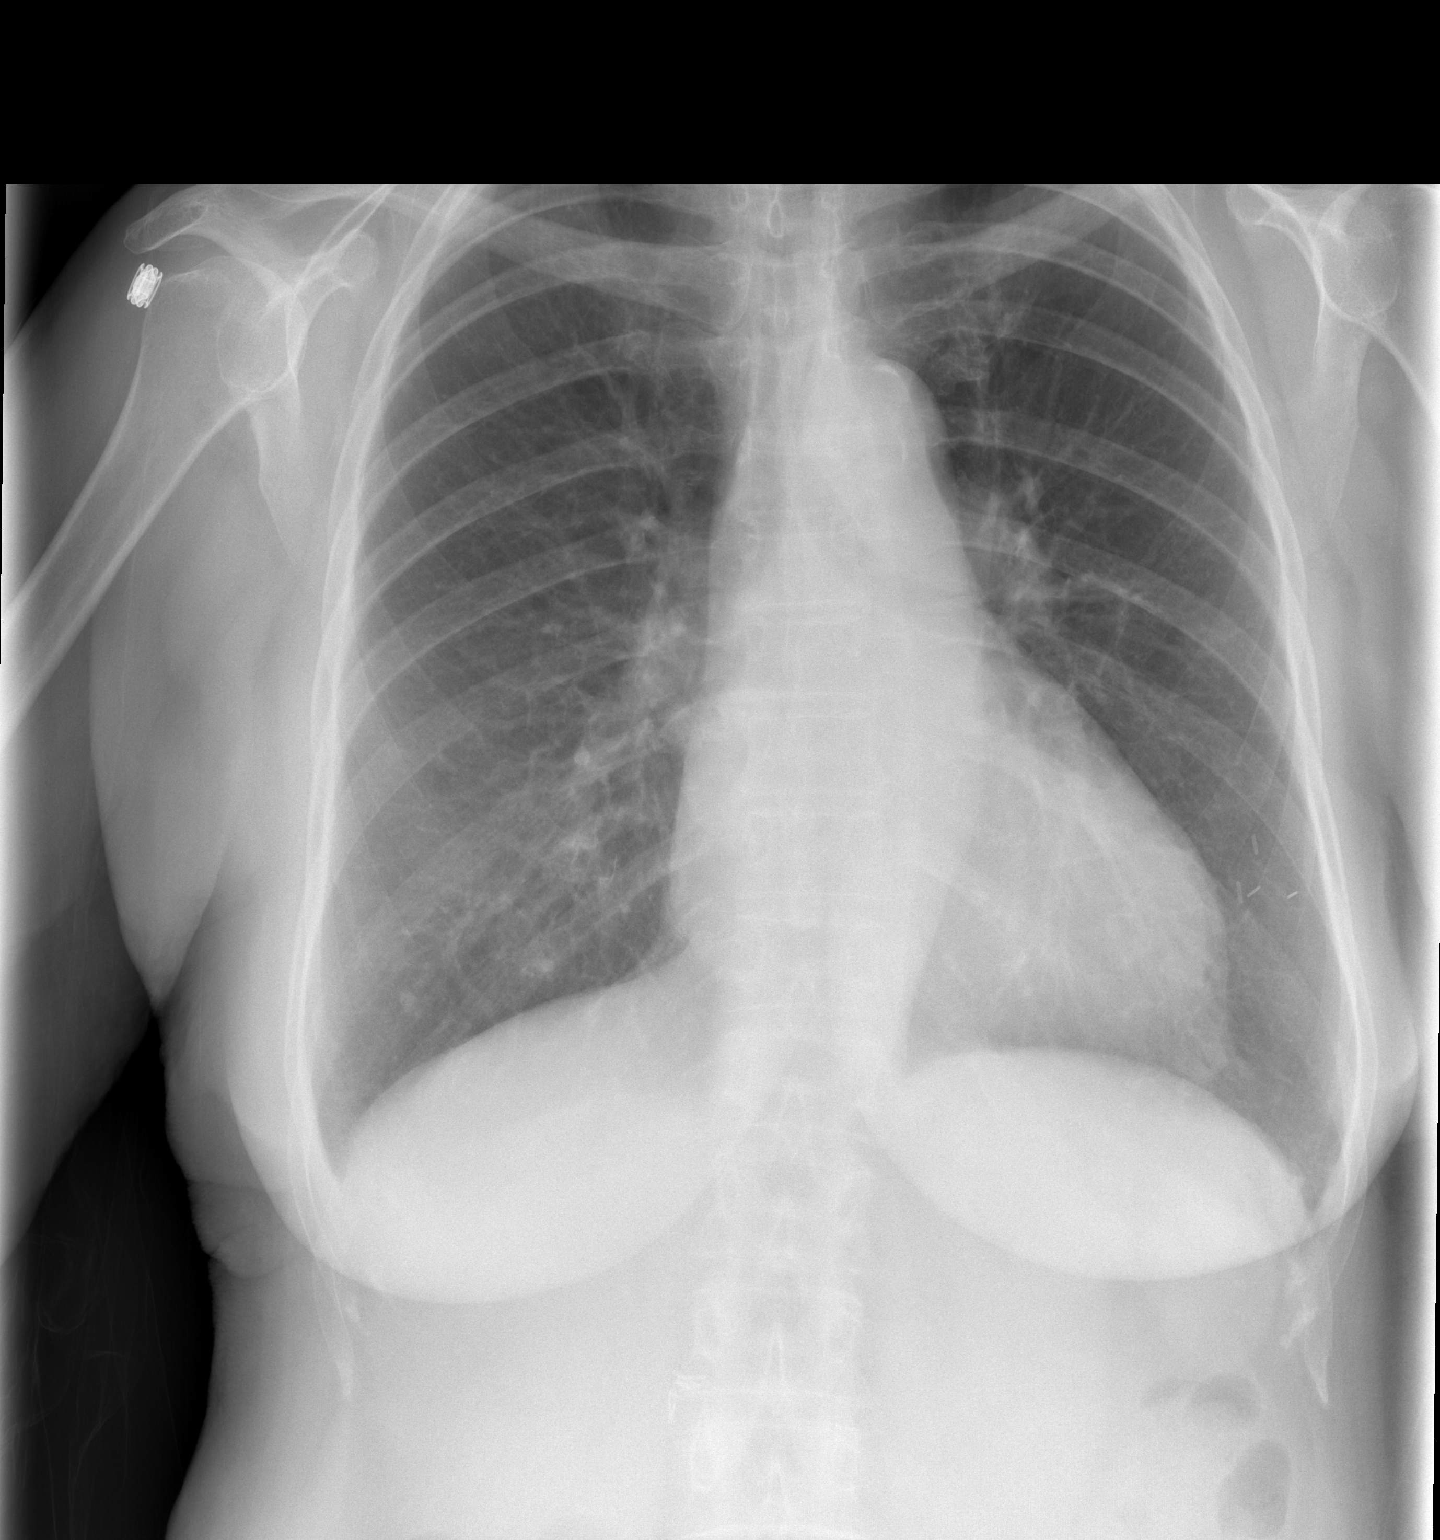

[1 of 1 positions shown; findings below may reference images not displayed]

FINDINGS: There is no focal consolidation, pleural effusion, or pneumothorax.
Probable small partially calcified granuloma at the right lung base.
The cardiac silhouette is within normal limits. Atherosclerotic
calcification of the aortic arch. Multiple surgical clips noted over
the left chest. No acute osseous pathology.
IMPRESSION: No acute cardiopulmonary process.

## 2020-06-07 ENCOUNTER — Other Ambulatory Visit: Payer: Self-pay

## 2020-06-07 DIAGNOSIS — N2889 Other specified disorders of kidney and ureter: Secondary | ICD-10-CM

## 2020-06-21 ENCOUNTER — Telehealth: Payer: Medicare Other

## 2020-06-26 ENCOUNTER — Ambulatory Visit
Admission: RE | Admit: 2020-06-26 | Discharge: 2020-06-26 | Disposition: A | Discharge status: Home / Self Care | Payer: Self-pay | Source: Ambulatory Visit | Attending: Interventional Radiology | Admitting: Interventional Radiology

## 2020-06-26 ENCOUNTER — Other Ambulatory Visit: Payer: Self-pay | Admitting: Interventional Radiology

## 2020-06-26 DIAGNOSIS — N2889 Other specified disorders of kidney and ureter: Secondary | ICD-10-CM

## 2020-07-03 ENCOUNTER — Encounter: Payer: Self-pay | Admitting: *Deleted

## 2020-07-03 ENCOUNTER — Ambulatory Visit
Admission: RE | Admit: 2020-07-03 | Discharge: 2020-07-03 | Disposition: A | Discharge status: Home / Self Care | Payer: Medicare Other | Source: Ambulatory Visit | Attending: Interventional Radiology | Admitting: Interventional Radiology

## 2020-07-03 ENCOUNTER — Other Ambulatory Visit: Payer: Self-pay

## 2020-07-03 DIAGNOSIS — N2889 Other specified disorders of kidney and ureter: Secondary | ICD-10-CM

## 2020-07-03 HISTORY — PX: IR RADIOLOGIST EVAL & MGMT: IMG5224

## 2020-07-03 NOTE — Progress Notes (Signed)
Chief Complaint: Patient was consulted remotely today (TeleHealth) for follow-up after cryoablation of a left renal carcinoma on 02/02/2019.   History of Present Illness: Alicia Cole is a 87 y.o. 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  She tolerated the procedure well and is currently without any complaints.  She denies any urinary symptoms.  Follow-up is performed today to review and discuss recent follow-up MRI findings.  Past Medical History:  Diagnosis Date  . Arthritis    hands,knee,lower back  . Atrial fibrillation ([place])   . 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 Chronic kidney disease    stage 3  . Dysrhythmia 2017   A Fib  . GERD (gastroesophageal reflux disease)   . Hypertension   . Sleep apnea    uses C-Pap    Past Surgical History:  Procedure Laterality Date  . ABDOMINAL HYSTERECTOMY  1984  . BREAST SURGERY Left 2014    Lumpectomy. restrictions on lt arm  . CHOLECYSTECTOMY  1994  . FRACTURE SURGERY Right 2005   leg. with plate  . IR RADIOLOGIST EVAL & MGMT  01/12/2019  . IR RADIOLOGIST EVAL & MGMT  02/22/2019  . IR RADIOLOGIST EVAL & MGMT  06/01/2019  . NASAL SINUS SURGERY Bilateral 2017  . RADIOLOGY WITH ANESTHESIA N/A 02/02/2019   Procedure: CT RENAL CRYOABLATION AND BIOPSY;  Surgeon: Aletta Edouard, MD;  Location: [facility];  Service: Radiology;  Laterality: N/A;  . ROTATOR CUFF REPAIR Bilateral [id number]    Allergies: Patient has no known allergies.  Medications: Prior to Admission medications   Medication Sig Start Date End Date Taking? Authorizing Provider  acetaminophen (TYLENOL) 500 MG tablet Take 500 mg by mouth daily after breakfast.    [provider]  Biotin 5000 MCG TABS Take 5,000 mcg by mouth every evening.    [provider]  Carboxymethylcellulose Sodium (THERATEARS) 0.25 % SOLN Place 1 drop into both eyes 3 (three) times daily as  needed (runny eyes.).    [provider]  carvedilol (COREG) 25 MG tablet Take 25 mg by mouth 2 (two) times daily. 11/19/18   [provider]  Coenzyme Q10 (COQ-10) 100 MG CAPS Take 100 mg by mouth every evening.    [provider]  ELIQUIS 5 MG TABS tablet Take 5 mg by mouth 2 (two) times daily. 11/19/18   [provider]  furosemide (LASIX) 40 MG tablet Take 40 mg by mouth every other day. In the morning. 11/19/18   [provider]  loratadine (CLARITIN) 10 MG tablet Take 10 mg by mouth daily.    [provider]  Magnesium 250 MG TABS Take 250 mg by mouth daily.    [provider]  Multiple Vitamin (MULTIVITAMIN WITH MINERALS) TABS tablet Take 1 tablet by mouth every evening. Centrum    [provider]  omeprazole (PRILOSEC) 20 MG capsule Take 20 mg by mouth every evening. 11/19/18   [provider]  Red Yeast Rice 600 MG TABS Take 600 mg by mouth every evening.    [provider]  Turmeric 500 MG TABS Take 500 mg by mouth every evening.    [provider]     No family history on file.  Social History   Socioeconomic History  . Marital status: Married    Spouse name: Not on file  . Number of children: Not on file  .  Years of education: Not on file  . Highest education level: Not on file  Occupational History  . Not on file  Tobacco Use  . Smoking status: Former Smoker    Packs/day: 0.50    Years: 10.00    Pack years: 5.00    Types: Cigarettes    Quit date: 03/24/1973    Years since quitting: 47.3  . Smokeless tobacco: Never Used  Vaping Use  . Vaping Use: Never used  Substance and Sexual Activity  . Alcohol use: Never  . Drug use: Never  . Sexual activity: Not on file  Other Topics Concern  . Not on file  Social History Narrative   Husband has memory issues.   Daughter passed away from [place] in June 12, 2018   Social Determinants of Health   Financial Resource Strain: Not on file   Food Insecurity: Not on file  Transportation Needs: Not on file  Physical Activity: Not on file  Stress: Not on file  Social Connections: Not on file    ECOG Status: 0 - Asymptomatic  Review of Systems  Constitutional: Negative.   Respiratory: Negative.   Cardiovascular: Negative.   Gastrointestinal: Negative.   Genitourinary: Negative.   Musculoskeletal: Negative.   Neurological: Negative.     Review of Systems: A 12 point ROS discussed and pertinent positives are indicated in the HPI above.  All other systems are negative.  Physical Exam No direct physical exam was performed (except for noted visual exam findings with Video Visits).   Vital Signs: There were no vitals taken for this visit.  Imaging: See below.  Labs: BUN 26, Cr 1.33 and est. GFR 39 mL/min on 05/30/20   Assessment and Plan:  I spoke with [name] by phone. We reviewed findings from the follow up MRI of the abdomen performed on 06/15/20 at [facility]. By my review of the imaging, compared to the prior MRI on 05/20/19, the left renal ablation defect shows contraction and decrease in size and there is no evidence of abnormal enhancement to suggest recurrent carcinoma by imaging. I recommended another follow up MRI in one year. I am still awaiting the actual report from the recent MRI study.   Electronically Signed: Azzie Roup 07/03/2020, 11:16 AM     I spent a total of 10 Minutes in remote  clinical consultation, greater than 50% of which was counseling/coordinating care post ablation of a left renal carcinoma.    Visit type: Audio only (telephone). Audio (no video) only due to patient's lack of internet/smartphone capability. Alternative for in-person consultation at [facility], [address], [place], [place]. This visit type was conducted due to national recommendations for restrictions regarding the COVID-19 Pandemic (e.g. social distancing).  This format is felt  to be most appropriate for this patient at this time.  All issues noted in this document were discussed and addressed.

## 2020-08-27 ENCOUNTER — Other Ambulatory Visit: Payer: Self-pay | Admitting: Interventional Radiology

## 2021-06-06 ENCOUNTER — Other Ambulatory Visit: Payer: Self-pay | Admitting: Interventional Radiology

## 2021-06-06 ENCOUNTER — Other Ambulatory Visit: Payer: Self-pay

## 2021-07-22 ENCOUNTER — Telehealth: Payer: Medicare Other

## 2021-08-06 ENCOUNTER — Ambulatory Visit
Admission: RE | Admit: 2021-08-06 | Discharge: 2021-08-06 | Disposition: A | Discharge status: Home / Self Care | Payer: Self-pay | Source: Ambulatory Visit | Attending: Interventional Radiology | Admitting: Interventional Radiology

## 2021-08-06 ENCOUNTER — Other Ambulatory Visit: Payer: Self-pay | Admitting: Interventional Radiology

## 2021-08-06 DIAGNOSIS — N2889 Other specified disorders of kidney and ureter: Secondary | ICD-10-CM

## 2021-08-12 ENCOUNTER — Ambulatory Visit
Admission: RE | Admit: 2021-08-12 | Discharge: 2021-08-12 | Disposition: A | Discharge status: Home / Self Care | Payer: Medicare Other | Source: Ambulatory Visit | Attending: Interventional Radiology | Admitting: Interventional Radiology

## 2021-08-12 ENCOUNTER — Encounter: Payer: Self-pay | Admitting: *Deleted

## 2021-08-12 DIAGNOSIS — N2889 Other specified disorders of kidney and ureter: Secondary | ICD-10-CM

## 2021-08-12 HISTORY — PX: IR RADIOLOGIST EVAL & MGMT: IMG5224

## 2021-08-12 NOTE — Progress Notes (Signed)
Chief Complaint: Patient was consulted remotely today (TeleHealth) for follow-up after cryoablation of a left renal carcinoma on 02/02/2019.   History of Present Illness: Alicia Cole is a 88 y.o. 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  She tolerated the procedure well and is currently without any complaints.  She denies any urinary symptoms currently but has had several UTI's this past year.  Follow-up is performed today to review and discuss recent follow-up MRI findings.  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 restrictions on lt arm   CHOLECYSTECTOMY  1994   FRACTURE SURGERY Right 2005   leg. with plate   IR RADIOLOGIST EVAL & MGMT  01/12/2019   IR RADIOLOGIST EVAL & MGMT  02/22/2019   IR RADIOLOGIST EVAL & MGMT  06/01/2019   IR RADIOLOGIST EVAL & MGMT  07/03/2020   NASAL SINUS SURGERY Bilateral 2017   RADIOLOGY WITH ANESTHESIA N/A 02/02/2019   Procedure: CT RENAL CRYOABLATION AND BIOPSY;  Surgeon: Aletta Edouard, MD;  Location: [facility];  Service: Radiology;  Laterality: N/A;   ROTATOR CUFF REPAIR Bilateral [id number]    Allergies: Patient has no known allergies.  Medications: Prior to Admission medications   Medication Sig Start Date End Date Taking? Authorizing Provider  acetaminophen (TYLENOL) 500 MG tablet Take 500 mg by mouth daily after breakfast.    [provider]  Biotin 5000 MCG TABS Take 5,000 mcg by mouth every evening.    [provider]  Carboxymethylcellulose Sodium  (THERATEARS) 0.25 % SOLN Place 1 drop into both eyes 3 (three) times daily as needed (runny eyes.).    [provider]  carvedilol (COREG) 25 MG tablet Take 25 mg by mouth 2 (two) times daily. 11/19/18   [provider]  Coenzyme Q10 (COQ-10) 100 MG CAPS Take 100 mg by mouth every evening.    [provider]  ELIQUIS 5 MG TABS tablet Take 5 mg by mouth 2 (two) times daily. 11/19/18   [provider]  furosemide (LASIX) 40 MG tablet Take 40 mg by mouth every other day. In the morning. 11/19/18   [provider]  loratadine (CLARITIN) 10 MG tablet Take 10 mg by mouth daily.    [provider]  Magnesium 250 MG TABS Take 250 mg by mouth daily.    [provider]  Multiple Vitamin (MULTIVITAMIN WITH MINERALS) TABS tablet Take 1 tablet by mouth every evening. Centrum    [provider]  omeprazole (PRILOSEC) 20 MG capsule Take 20 mg by mouth every evening. 11/19/18   [provider]  Red Yeast Rice 600 MG TABS Take 600 mg by mouth every evening.    [provider]  Turmeric 500 MG TABS Take 500 mg by mouth every evening.    [provider]     No family history on file.  Social History   Socioeconomic History   Marital  status: Married    Spouse name: Not on file   Number of children: Not on file   Years of education: Not on file   Highest education level: Not on file  Occupational History   Not on file  Tobacco Use   Smoking status: Former    Packs/day: 0.50    Years: 10.00    Pack years: 5.00    Types: Cigarettes    Quit date: 03/24/1973    Years since quitting: 48.4   Smokeless tobacco: Never  Vaping Use   Vaping Use: Never used  Substance and Sexual Activity   Alcohol use: Never   Drug use: Never   Sexual activity: Not on file  Other Topics Concern   Not on file  Social History Narrative   Husband has memory issues.   Daughter passed away from [place] in 2018/06/06   Social Determinants  of Health   Financial Resource Strain: Not on file  Food Insecurity: Not on file  Transportation Needs: Not on file  Physical Activity: Not on file  Stress: Not on file  Social Connections: Not on file    ECOG Status: 0 - Asymptomatic  Review of Systems  Constitutional: Negative.   Respiratory: Negative.    Cardiovascular: Negative.   Gastrointestinal: Negative.   Genitourinary: Negative.   Musculoskeletal: Negative.   Neurological: Negative.    Review of Systems: A 12 point ROS discussed and pertinent positives are indicated in the HPI above.  All other systems are negative.  Physical Exam No direct physical exam was performed (except for noted visual exam findings with Video Visits).   Vital Signs: There were no vitals taken for this visit.  Imaging: No results found. See below.   Assessment and Plan:  I spoke with Alicia Cole by phone. We reviewed findings from the follow up MRI of the abdomen performed on 07/15/21 at [facility]. By my review of the imaging, compared to the prior MRI in 2022, the left renal ablation defect shows further contraction and decrease in size and there is no evidence of abnormal enhancement to suggest recurrent carcinoma by imaging. I recommended another follow up MRI in one year.   Electronically Signed: Azzie Roup 08/12/2021, 9:28 AM    I spent a total of 10 Minutes in remote  clinical consultation, greater than 50% of which was counseling/coordinating care post ablation of a left renal carcinoma.    Visit type: Audio only (telephone). Audio (no video) only due to patient's lack of internet/smartphone capability. Alternative for in-person consultation at [facility], [address], [place], [place]. This visit type was conducted due to national recommendations for restrictions regarding the COVID-19 Pandemic (e.g. social distancing).  This format is felt to be most appropriate for this patient at this  time.  All issues noted in this document were discussed and addressed.

## 2022-06-30 ENCOUNTER — Other Ambulatory Visit: Payer: Self-pay | Admitting: Interventional Radiology

## 2022-06-30 DIAGNOSIS — C642 Malignant neoplasm of left kidney, except renal pelvis: Secondary | ICD-10-CM

## 2022-07-07 ENCOUNTER — Inpatient Hospital Stay
Admission: RE | Admit: 2022-07-07 | Discharge: 2022-07-07 | Disposition: A | Discharge status: Home / Self Care | Payer: Self-pay | Source: Ambulatory Visit | Attending: Urology | Admitting: Urology

## 2022-07-07 ENCOUNTER — Inpatient Hospital Stay
Admission: RE | Admit: 2022-07-07 | Discharge: 2022-07-07 | Disposition: A | Discharge status: Home / Self Care | Payer: Self-pay | Source: Ambulatory Visit | Attending: Interventional Radiology | Admitting: Interventional Radiology

## 2022-07-07 ENCOUNTER — Other Ambulatory Visit: Payer: Self-pay | Admitting: Interventional Radiology

## 2022-07-07 ENCOUNTER — Other Ambulatory Visit: Payer: Self-pay | Admitting: Urology

## 2022-07-07 DIAGNOSIS — N281 Cyst of kidney, acquired: Secondary | ICD-10-CM

## 2022-07-07 DIAGNOSIS — C642 Malignant neoplasm of left kidney, except renal pelvis: Secondary | ICD-10-CM

## 2022-07-23 ENCOUNTER — Ambulatory Visit
Admission: RE | Admit: 2022-07-23 | Discharge: 2022-07-23 | Disposition: A | Discharge status: Home / Self Care | Payer: Medicare Other | Source: Ambulatory Visit | Attending: Interventional Radiology | Admitting: Interventional Radiology

## 2022-07-23 DIAGNOSIS — C642 Malignant neoplasm of left kidney, except renal pelvis: Secondary | ICD-10-CM

## 2022-07-23 NOTE — Progress Notes (Signed)
Chief Complaint: Patient was consulted remotely today (TeleHealth) for follow-up after cryoablation of a left renal carcinoma on 02/02/2019.   History of Present Illness: Alicia Cole is a 89 y.o. 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  She tolerated the procedure well and is currently without any complaints.    Past Surgical History:  Procedure Laterality Date   ABDOMINAL HYSTERECTOMY  1984   BREAST SURGERY Left 2014    Lumpectomy. restrictions on lt arm   CHOLECYSTECTOMY  1994   FRACTURE SURGERY Right 2005   leg. with plate   IR RADIOLOGIST EVAL & MGMT  01/12/2019   IR RADIOLOGIST EVAL & MGMT  02/22/2019   IR RADIOLOGIST EVAL & MGMT  06/01/2019   IR RADIOLOGIST EVAL & MGMT  07/03/2020   IR RADIOLOGIST EVAL & MGMT  08/12/2021   NASAL SINUS SURGERY Bilateral 2017   RADIOLOGY WITH ANESTHESIA N/A 02/02/2019   Procedure: CT RENAL CRYOABLATION AND BIOPSY;  Surgeon: Irish Lack, MD;  Location: [facility];  Service: Radiology;  Laterality: N/A;   ROTATOR CUFF REPAIR Bilateral [id number]    Allergies: Patient has no known allergies.  Medications: Prior to Admission medications   Medication Sig Start Date End Date Taking? Authorizing Provider  acetaminophen (TYLENOL) 500 MG tablet Take 500 mg by mouth daily after breakfast.    [provider]  Biotin 5000 MCG TABS Take 5,000 mcg by mouth every evening.    [provider]  Carboxymethylcellulose Sodium (THERATEARS) 0.25 % SOLN Place 1 drop into both eyes 3 (three) times daily as needed (runny eyes.).    [provider]  carvedilol (COREG) 25 MG tablet Take 25 mg by mouth 2 (two) times daily. 11/19/18   [provider]  Coenzyme Q10 (COQ-10) 100 MG CAPS Take 100 mg by mouth every evening.    [provider]  ELIQUIS 5 MG TABS tablet Take 5 mg by mouth 2 (two) times daily. 11/19/18   [provider]  furosemide  (LASIX) 40 MG tablet Take 40 mg by mouth every other day. In the morning. 11/19/18   [provider]  loratadine (CLARITIN) 10 MG tablet Take 10 mg by mouth daily.    [provider]  Magnesium 250 MG TABS Take 250 mg by mouth daily.    [provider]  Multiple Vitamin (MULTIVITAMIN WITH MINERALS) TABS tablet Take 1 tablet by mouth every evening. Centrum    [provider]  omeprazole (PRILOSEC) 20 MG capsule Take 20 mg by mouth every evening. 11/19/18   [provider]  Red Yeast Rice 600 MG TABS Take 600 mg by mouth every evening.    [provider]  Turmeric 500 MG TABS Take 500 mg by mouth every evening.    [provider]     No family history on file.  Social History   Socioeconomic History   Marital status: Married    Spouse name: Not on file   Number of children: Not on file   Years of education: Not on file   Highest education level: Not on file  Occupational History   Not on file  Tobacco Use   Smoking status: Former    Packs/day: 0.50    Years: 10.00    Additional pack years: 0.00    Total pack years: 5.00    Types: Cigarettes    Quit date: 03/24/1973    Years since quitting: 79.3  Smokeless tobacco: Never  Vaping Use   Vaping Use: Never used  Substance and Sexual Activity   Alcohol use: Never   Drug use: Never   Sexual activity: Not on file  Other Topics Concern   Not on file  Social History Narrative   Husband has memory issues.   Daughter passed away from  [place] in 12-May-2018  Social Determinants of Health   Financial Resource Strain: Not on file  Food Insecurity: Not on file  Transportation Needs: Not on file  Physical Activity: Not on file  Stress: Not on file  Social Connections: Not on file    ECOG Status: 0 - Asymptomatic  Review of Systems  Constitutional: Negative.   Respiratory: Negative.    Cardiovascular: Negative.   Gastrointestinal: Negative.   Genitourinary: Negative.    Musculoskeletal: Negative.   Neurological: Negative.     Review of Systems: A 12 point ROS discussed and pertinent positives are indicated in the HPI above.  All other systems are negative.   Physical Exam No direct physical exam was performed (except for noted visual exam findings with Video Visits).   Vital Signs: There were no vitals taken for this visit.  Imaging: No results found.  Labs:  CBC: No results for input(s): "WBC", "HGB", "HCT", "PLT" in the last 8760 hours.  COAGS: No results for input(s): "INR", "APTT" in the last 8760 hours.  BMP: No results for input(s): "NA", "K", "CL", "CO2", "GLUCOSE", "BUN", "CALCIUM", "CREATININE", "GFRNONAA", "GFRAA" in the last 8760 hours.  Invalid input(s): "CMP"   Assessment and Plan:  I spoke with Alicia Cole by phone. We reviewed findings from the follow up MRI of the abdomen performed on 06/26/2022 at [facility]. By my review of the imaging, compared to prior MRI studies in 2023 and 2022, the left renal ablation defect shows further contraction and decrease in size and there is no evidence of abnormal enhancement to suggest recurrent carcinoma by imaging.    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  The small enhancing nodule in the left cystic lesion was considered new on the current study but in retrospect appears to have been present on prior studies based on my review and I told Alicia Cole that this appears stable with no growth.  I recommended another follow up MRI in one year. I also asked Alicia Cole to make sure she mentions to the MR imaging staff at [facility] that she has a history of renal carcinoma of the left kidney and is status post prior cryoablation in 2020, which is apparent was not translated in her history on the last two MR reports from 2024 and  2023.   Electronically Signed: Reola Calkins 07/23/2022, 11:09 AM    I spent a total of 10 Minutes in remote  clinical consultation, greater than 50% of which was counseling/coordinating care post cryoablation of a left renal carcinoma.    Visit type: Audio only (telephone). Audio (no video) only due to patient's lack of internet/smartphone capability. Alternative for in-person consultation at [facility], [address], [place], [place]. This visit type was conducted due to national recommendations for restrictions regarding the COVID-19 Pandemic (e.g. social distancing).  This format is felt to be most appropriate for this patient at this time.  All issues noted in this document were discussed and addressed.

## 2023-08-11 ENCOUNTER — Other Ambulatory Visit: Payer: Self-pay | Admitting: Interventional Radiology

## 2023-08-11 DIAGNOSIS — C642 Malignant neoplasm of left kidney, except renal pelvis: Secondary | ICD-10-CM

## 2023-08-13 ENCOUNTER — Ambulatory Visit
Admission: RE | Admit: 2023-08-13 | Discharge: 2023-08-13 | Disposition: A | Discharge status: Home / Self Care | Source: Ambulatory Visit | Attending: Interventional Radiology | Admitting: Interventional Radiology

## 2023-08-13 DIAGNOSIS — C642 Malignant neoplasm of left kidney, except renal pelvis: Secondary | ICD-10-CM

## 2023-08-13 HISTORY — PX: IR RADIOLOGIST EVAL & MGMT: IMG5224

## 2023-08-13 NOTE — Progress Notes (Signed)
 Chief Complaint: Patient was consulted remotely today (TeleHealth) for follow-up after cryoablation of a left renal carcinoma on 02/02/2019.    History of Present Illness: Alicia Cole is a 89 y.o. 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 She tolerated the procedure well and is currently without any abdominal or urinary complaints. A follow up MRI was performed at [facility] on 07/23/23. She is scheduled for a left knee arthroplasty next month.  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 restrictions on lt arm   CHOLECYSTECTOMY  1994   FRACTURE SURGERY Right 2005   leg. with plate   IR RADIOLOGIST EVAL & MGMT  01/12/2019   IR RADIOLOGIST EVAL & MGMT  02/22/2019   IR RADIOLOGIST EVAL & MGMT  06/01/2019   IR RADIOLOGIST EVAL & MGMT  07/03/2020   IR RADIOLOGIST EVAL & MGMT  08/12/2021   NASAL SINUS SURGERY Bilateral 2017   RADIOLOGY WITH ANESTHESIA N/A 02/02/2019   Procedure: CT RENAL CRYOABLATION AND BIOPSY;  Surgeon: Erica Hau, MD;  Location: [facility];  Service: Radiology;  Laterality: N/A;   ROTATOR CUFF REPAIR Bilateral [id number]    Allergies: Patient has no known allergies.  Medications: Prior to Admission medications   Medication Sig Start Date End Date Taking? Authorizing Provider  acetaminophen  (TYLENOL ) 500 MG tablet Take 500 mg by mouth daily after breakfast.    [provider]  Biotin 5000 MCG TABS Take 5,000 mcg by mouth every evening.    [provider]   Carboxymethylcellulose Sodium (THERATEARS) 0.25 % SOLN Place 1 drop into both eyes 3 (three) times daily as needed (runny eyes.).    [provider]  carvedilol (COREG) 25 MG tablet Take 25 mg by mouth 2 (two) times daily. 11/19/18   [provider]  Coenzyme Q10 (COQ-10) 100 MG CAPS Take 100 mg by mouth every evening.    [provider]  ELIQUIS 5 MG TABS tablet Take 5 mg by mouth 2 (two) times daily. 11/19/18   [provider]  furosemide  (LASIX ) 40 MG tablet Take 40 mg by mouth every other day. In the morning. 11/19/18   [provider]  loratadine  (CLARITIN ) 10 MG tablet Take 10 mg by mouth daily.    [provider]  Magnesium 250 MG TABS Take 250 mg by mouth daily.    [provider]  Multiple Vitamin (MULTIVITAMIN WITH MINERALS) TABS tablet Take 1 tablet by mouth every evening. Centrum    [provider]  omeprazole (PRILOSEC) 20 MG capsule Take 20 mg by mouth every evening. 11/19/18   [provider]  Red Yeast Rice 600 MG TABS Take 600 mg by mouth every evening.    [provider]  Turmeric 500 MG TABS Take 500 mg by mouth every evening.    [provider]     No family history on file.  Social History  Socioeconomic History   Marital status: Married    Spouse name: Not on file   Number of children: Not on file   Years of education: Not on file   Highest education level: Not on file  Occupational History   Not on file  Tobacco Use   Smoking status: Former    Current packs/day: 0.00    Average packs/day: 0.5 packs/day for 10.0 years (5.0 ttl pk-yrs)    Types: Cigarettes    Start date: 03/25/1963    Quit date: 03/24/1973    Years since quitting: 50.4   Smokeless tobacco: Never  Vaping Use   Vaping status: Never Used  Substance and Sexual Activity   Alcohol  use: Never   Drug use: Never   Sexual activity: Not on file  Other Topics Concern   Not on file  Social History Narrative    Husband has memory issues.   Daughter passed away from [place] in 05-02-18  Social Drivers of Health   Financial Resource Strain: Not on [employer] Insecurity: Not on file  Transportation Needs: Not on file  Physical Activity: Not on file  Stress: Not on file  Social Connections: Unknown (08/02/2021)   Received [facility]   Social Network    Social Network: Not on file    ECOG Status: 0 - Asymptomatic  Review of Systems  Constitutional: Negative.   Respiratory: Negative.    Cardiovascular: Negative.   Gastrointestinal: Negative.   Genitourinary: Negative.   Musculoskeletal:        Left knee pain and pain with walking due to arthritis.    Review of Systems: A 12 point ROS discussed and pertinent positives are indicated in the HPI above.  All other systems are negative.   Physical Exam No direct physical exam was performed (except for noted visual exam findings with Video Visits).   Vital Signs: There were no vitals taken for this visit.  Imaging: See below.   Assessment and Plan:  I spoke with Ms. Marton by phone. I reviewed the MRI performed at [facility] on 07/23/23. The left lateral ablation defect continues to retract and appears smaller compared to prior studies with no evidence of carcinoma recurrence. 1.7 cm complex cyst posterior to the ablation defect continues to demonstrate enhancing septations and is consistent with a Bosniak IIF cyst. 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  Despite being nearly 5 years post ablation of her left RCC in November of 2020, I did recommend keeping an eye on the complex cysts with periodic MRI. Ms. Furnas will be 89 years old in July, however, and there may not be a need for active surveillance much longer. I recommended one more follow up MRI in the later part of next year.    Electronically Signed: Aileen Alexanders 08/13/2023, 9:53 AM    I spent a  total of  10 Minutes in remote  clinical consultation, greater than 50% of which was counseling/coordinating care for .    Visit type: Audio only (telephone). Audio (no video) only due to patient's lack of internet/smartphone capability. Alternative for in-person consultation at [facility] [address] [place], [place], [place]. This visit type was conducted due to national recommendations for restrictions regarding the COVID-19 Pandemic (e.g. social distancing).  This format is felt to be most appropriate for this patient at this time.  All issues noted in this document were discussed and addressed.
